# Patient Record
Sex: Male | Born: 1945 | Race: Black or African American | Hispanic: No | Marital: Married | Smoking: Former smoker
Health system: Southern US, Community
[De-identification: ages and names within clinical notes are randomized; demographics above are authoritative.]

## PROBLEM LIST (undated history)

## (undated) DIAGNOSIS — I739 Peripheral vascular disease, unspecified: Secondary | ICD-10-CM

## (undated) DIAGNOSIS — I10 Essential (primary) hypertension: Secondary | ICD-10-CM

## (undated) DIAGNOSIS — N400 Enlarged prostate without lower urinary tract symptoms: Secondary | ICD-10-CM

## (undated) HISTORY — PX: HERNIA REPAIR: SHX51

## (undated) HISTORY — DX: Essential (primary) hypertension: I10

---

## 2020-06-21 ENCOUNTER — Encounter: Payer: Self-pay | Admitting: Anesthesiology

## 2020-06-21 ENCOUNTER — Emergency Department (EMERGENCY_DEPARTMENT_HOSPITAL)
Admission: EM | Admit: 2020-06-21 | Discharge: 2020-06-21 | Disposition: A | Discharge status: Home / Self Care | Payer: Medicare Other | Source: Home / Self Care | Attending: Emergency Medicine | Admitting: Emergency Medicine

## 2020-06-21 ENCOUNTER — Other Ambulatory Visit: Payer: Self-pay

## 2020-06-21 ENCOUNTER — Encounter: Payer: Self-pay | Admitting: Radiology

## 2020-06-21 ENCOUNTER — Encounter
Admission: EM | Disposition: A | Discharge status: Home / Self Care | Payer: Self-pay | Source: Home / Self Care | Attending: Emergency Medicine

## 2020-06-21 ENCOUNTER — Emergency Department: Payer: Medicare Other

## 2020-06-21 DIAGNOSIS — K420 Umbilical hernia with obstruction, without gangrene: Secondary | ICD-10-CM | POA: Insufficient documentation

## 2020-06-21 DIAGNOSIS — Z20822 Contact with and (suspected) exposure to covid-19: Secondary | ICD-10-CM | POA: Insufficient documentation

## 2020-06-21 DIAGNOSIS — K436 Other and unspecified ventral hernia with obstruction, without gangrene: Secondary | ICD-10-CM | POA: Diagnosis not present

## 2020-06-21 LAB — COMPREHENSIVE METABOLIC PANEL
ALT: 18 U/L (ref 0–44)
AST: 17 U/L (ref 15–41)
Albumin: 4.2 g/dL (ref 3.5–5.0)
Alkaline Phosphatase: 72 U/L (ref 38–126)
Anion gap: 12 (ref 5–15)
BUN: 15 mg/dL (ref 8–23)
CO2: 28 mmol/L (ref 22–32)
Calcium: 9.9 mg/dL (ref 8.9–10.3)
Chloride: 98 mmol/L (ref 98–111)
Creatinine, Ser: 1.18 mg/dL (ref 0.61–1.24)
GFR, Estimated: >60 mL/min (ref >60)
Glucose, Bld: 185 mg/dL — ABNORMAL HIGH (ref 70–99)
Potassium: 3.8 mmol/L (ref 3.5–5.1)
Sodium: 138 mmol/L (ref 135–145)
Total Bilirubin: 1.7 mg/dL — ABNORMAL HIGH (ref 0.3–1.2)
Total Protein: 8.1 g/dL (ref 6.5–8.1)

## 2020-06-21 LAB — CBC
HCT: 47.9 % (ref 39.0–52.0)
Hemoglobin: 15.8 g/dL (ref 13.0–17.0)
MCH: 29.3 pg (ref 26.0–34.0)
MCHC: 33 g/dL (ref 30.0–36.0)
MCV: 88.9 fL (ref 80.0–100.0)
Platelets: 298 10*3/uL (ref 150–400)
RBC: 5.39 MIL/uL (ref 4.22–5.81)
RDW: 12.7 % (ref 11.5–15.5)
WBC: 13.5 10*3/uL — ABNORMAL HIGH (ref 4.0–10.5)
nRBC: 0 % (ref 0.0–0.2)

## 2020-06-21 LAB — LACTIC ACID, PLASMA: Lactic Acid, Venous: 2.2 mmol/L (ref 0.5–1.9)

## 2020-06-21 LAB — RESPIRATORY PANEL BY RT PCR (FLU A&B, COVID)
Influenza A by PCR: NEGATIVE
Influenza B by PCR: NEGATIVE
SARS Coronavirus 2 by RT PCR: NEGATIVE

## 2020-06-21 LAB — LIPASE, BLOOD: Lipase: 30 U/L (ref 11–51)

## 2020-06-21 SURGERY — REPAIR, HERNIA, VENTRAL, ROBOT-ASSISTED
Anesthesia: General

## 2020-06-21 MED ORDER — LACTATED RINGERS IV BOLUS
500.0000 mL | Freq: Once | INTRAVENOUS | Status: AC
  Administered 2020-06-21: 500 mL via INTRAVENOUS

## 2020-06-21 MED ORDER — HYDROMORPHONE HCL 1 MG/ML IJ SOLN
0.5000 mg | Freq: Once | INTRAMUSCULAR | Status: AC
  Administered 2020-06-21: 0.5 mg via INTRAVENOUS
  Filled 2020-06-21: qty 1

## 2020-06-21 MED ORDER — PROPOFOL 10 MG/ML IV BOLUS
INTRAVENOUS | Status: AC
  Filled 2020-06-21: qty 20

## 2020-06-21 MED ORDER — LOSARTAN POTASSIUM 50 MG PO TABS
100.0000 mg | ORAL_TABLET | Freq: Once | ORAL | Status: AC
  Administered 2020-06-21: 100 mg via ORAL
  Filled 2020-06-21: qty 2

## 2020-06-21 MED ORDER — LABETALOL HCL 200 MG PO TABS
200.0000 mg | ORAL_TABLET | Freq: Once | ORAL | Status: AC
  Administered 2020-06-21: 200 mg via ORAL
  Filled 2020-06-21: qty 1

## 2020-06-21 MED ORDER — ONDANSETRON HCL 4 MG/2ML IJ SOLN: 4.0000 mg | Freq: Once | INTRAMUSCULAR | Status: AC

## 2020-06-21 MED ORDER — PIPERACILLIN-TAZOBACTAM 3.375 G IVPB 30 MIN: 3.3750 g | Freq: Once | INTRAVENOUS | Status: DC

## 2020-06-21 MED ORDER — ALUM & MAG HYDROXIDE-SIMETH 200-200-20 MG/5ML PO SUSP
30.0000 mL | Freq: Once | ORAL | Status: AC
  Administered 2020-06-21: 30 mL via ORAL
  Filled 2020-06-21: qty 30

## 2020-06-21 MED ORDER — IOHEXOL 300 MG/ML  SOLN
100.0000 mL | Freq: Once | INTRAMUSCULAR | Status: AC | PRN
  Administered 2020-06-21: 100 mL via INTRAVENOUS
  Filled 2020-06-21: qty 100

## 2020-06-21 MED ORDER — FENTANYL CITRATE (PF) 100 MCG/2ML IJ SOLN
INTRAMUSCULAR | Status: AC
  Filled 2020-06-21: qty 2

## 2020-06-21 MED ORDER — ONDANSETRON HCL 4 MG/2ML IJ SOLN
INTRAMUSCULAR | Status: AC
  Administered 2020-06-21: 4 mg via INTRAVENOUS
  Filled 2020-06-21: qty 2

## 2020-06-21 MED ORDER — AMLODIPINE BESYLATE 5 MG PO TABS
10.0000 mg | ORAL_TABLET | Freq: Once | ORAL | Status: AC
  Administered 2020-06-21: 10 mg via ORAL
  Filled 2020-06-21: qty 2

## 2020-06-21 SURGICAL SUPPLY — 45 items
CANISTER SUCT 1200ML W/VALVE (MISCELLANEOUS) ×3 IMPLANT
CHLORAPREP W/TINT 26 (MISCELLANEOUS) ×3 IMPLANT
COVER TIP SHEARS 8 DVNC (MISCELLANEOUS) ×1 IMPLANT
COVER TIP SHEARS 8MM DA VINCI (MISCELLANEOUS) ×2
COVER WAND RF STERILE (DRAPES) ×3 IMPLANT
DECANTER SPIKE VIAL GLASS SM (MISCELLANEOUS) ×3 IMPLANT
DEFOGGER SCOPE WARMER CLEARIFY (MISCELLANEOUS) ×3 IMPLANT
DERMABOND ADVANCED (GAUZE/BANDAGES/DRESSINGS) ×2
DERMABOND ADVANCED .7 DNX12 (GAUZE/BANDAGES/DRESSINGS) ×1 IMPLANT
DEVICE SECURE STRAP 25 ABSORB (INSTRUMENTS) ×3 IMPLANT
DRAPE ARM DVNC X/XI (DISPOSABLE) ×4 IMPLANT
DRAPE COLUMN DVNC XI (DISPOSABLE) ×1 IMPLANT
DRAPE DA VINCI XI ARM (DISPOSABLE) ×8
DRAPE DA VINCI XI COLUMN (DISPOSABLE) ×2
ELECT REM PT RETURN 9FT ADLT (ELECTROSURGICAL) ×3
ELECTRODE REM PT RTRN 9FT ADLT (ELECTROSURGICAL) ×1 IMPLANT
GLOVE ORTHO TXT STRL SZ7.5 (GLOVE) ×9 IMPLANT
GOWN STRL REUS W/ TWL LRG LVL3 (GOWN DISPOSABLE) ×4 IMPLANT
GOWN STRL REUS W/TWL LRG LVL3 (GOWN DISPOSABLE) ×8
GRASPER SUT TROCAR 14GX15 (MISCELLANEOUS) IMPLANT
IRRIGATION STRYKERFLOW (MISCELLANEOUS) IMPLANT
IRRIGATOR STRYKERFLOW (MISCELLANEOUS)
IV NS 1000ML (IV SOLUTION)
IV NS 1000ML BAXH (IV SOLUTION) IMPLANT
KIT PINK PAD W/HEAD ARE REST (MISCELLANEOUS) ×3
KIT PINK PAD W/HEAD ARM REST (MISCELLANEOUS) ×1 IMPLANT
KIT TURNOVER KIT A (KITS) ×3 IMPLANT
NEEDLE HYPO 22GX1.5 SAFETY (NEEDLE) ×3 IMPLANT
NEEDLE INSUFFLATION 14GA 120MM (NEEDLE) ×3 IMPLANT
NS IRRIG 500ML POUR BTL (IV SOLUTION) ×3 IMPLANT
PACK LAP CHOLECYSTECTOMY (MISCELLANEOUS) ×3 IMPLANT
SCISSORS METZENBAUM CVD 33 (INSTRUMENTS) ×3 IMPLANT
SEAL CANN UNIV 5-8 DVNC XI (MISCELLANEOUS) ×3 IMPLANT
SEAL XI 5MM-8MM UNIVERSAL (MISCELLANEOUS) ×6
SET TUBE SMOKE EVAC HIGH FLOW (TUBING) ×3 IMPLANT
SOLUTION ELECTROLUBE (MISCELLANEOUS) ×3 IMPLANT
SUT MNCRL 4-0 (SUTURE) ×2
SUT MNCRL 4-0 27XMFL (SUTURE) ×1
SUT STRATAFIX 0 PDS+ CT-2 23 (SUTURE) ×3
SUT VICRYL 0 AB UR-6 (SUTURE) ×3 IMPLANT
SUT VLOC 90 2/L VL 12 GS22 (SUTURE) ×3 IMPLANT
SUTURE MNCRL 4-0 27XMF (SUTURE) ×1 IMPLANT
SUTURE STRATFX 0 PDS+ CT-2 23 (SUTURE) ×1 IMPLANT
TROCAR XCEL 12X100 BLDLESS (ENDOMECHANICALS) ×3 IMPLANT
TROCAR Z-THREAD FIOS 11X100 BL (TROCAR) ×3 IMPLANT

## 2020-06-21 NOTE — ED Notes (Addendum)
Pt presents to the ED for mid gastric abdominal pain that started late afternoon yesterday. Pt states pain came out of nowhere. Pt also states he had 2 episodes of vomiting. Denies diarrhea. Rates pain 8/10. Hx of hernia surgery. Denies CP/SOB/fevers. Pt A&Ox4 and NAD. Ambulatory to room.

## 2020-06-21 NOTE — Consult Note (Signed)
[facility] SURGICAL CONSULTATION NOTE (initial) - cpt:  50093 (Outpatient/ED)   HISTORY OF PRESENT ILLNESS (HPI):  74 y.o. male presented to [facility] ED today for evaluation of abdominal pain with associated incarcerated umbilical hernia and small bowel obstruction.  He experienced acute worsening of his known umbilical hernia discomfort to the point of pain without known precipitating factor.  He felt some significant bloating, and associated nausea.   He does endorse an episode of non-bloody, nonbilious emesis Surgery is consulted by ED physician Dr. Antoine Primas in this context for evaluation and management incarcerated umbilical hernia, with CT evidence of SBO.  He has a prior history of inguinal hernia repairs.  PAST MEDICAL HISTORY (PMH):  History reviewed. No pertinent past medical history.   PAST SURGICAL HISTORY [facility]):  Bilateral inguinal hernia repair  MEDICATIONS:  Prior to Admission medications   Not on File     ALLERGIES:  Not on File   SOCIAL HISTORY:  Social History   Socioeconomic History  . Marital status: Unknown    Spouse name: Not on file  . Number of children: Not on file  . Years of education: Not on file  . Highest education level: Not on file  Occupational History  . Not on file  Tobacco Use  . Smoking status: Not on file  Substance and Sexual Activity  . Alcohol use: Not on file  . Drug use: Not on file  . Sexual activity: Not on file  Other Topics Concern  . Not on file  Social History Narrative  . Not on file   Social Determinants of Health   Financial Resource Strain:   . Difficulty of Paying Living Expenses: Not on file  Food Insecurity:   . Worried About Programme researcher, broadcasting/film/video in the Last Year: Not on file  . Ran Out of Food in the Last Year: Not on file  Transportation Needs:   . Lack of Transportation (Medical): Not on file  . Lack of Transportation (Non-Medical): Not on file  Physical Activity:   . Days of Exercise  per Week: Not on file  . Minutes of Exercise per Session: Not on file  Stress:   . Feeling of Stress : Not on file  Social Connections:   . Frequency of Communication with Friends and Family: Not on file  . Frequency of Social Gatherings with Friends and Family: Not on file  . Attends Religious Services: Not on file  . Active Member of Clubs or Organizations: Not on file  . Attends Banker Meetings: Not on file  . Marital Status: Not on file  Intimate Partner Violence:   . Fear of Current or Ex-Partner: Not on file  . Emotionally Abused: Not on file  . Physically Abused: Not on file  . Sexually Abused: Not on file     FAMILY HISTORY:  No family history on file.    REVIEW OF SYSTEMS:  Constitutional: Negative for chills and fever.  HENT: Negative for sore throat.   Eyes: Negative for pain.  Respiratory: Negative for cough and stridor.   Cardiovascular: Negative for chest pain.  Gastrointestinal: Positive for abdominal pain, nausea and vomiting.  Genitourinary: Negative for dysuria.  Musculoskeletal: Negative for myalgias.  Skin: Negative for rash.  Neurological: Negative for seizures, loss of consciousness and headaches.  Psychiatric/Behavioral: Negative for suicidal ideas.   VITAL SIGNS:  Temp:  [98.4 F (36.9 C)] 98.4 F (36.9 C) (11/01 1203) Pulse Rate:  [93] 93 (11/01 1203)  Resp:  [20] 20 (11/01 1203) BP: (192)/(87) 192/87 (11/01 1203) SpO2:  [96 %] 96 % (11/01 1203) Weight:  [96.6 kg] 96.6 kg (11/01 1204)     Height: 5\' 11"  (180.3 cm) Weight: 96.6 kg BMI (Calculated): 29.72   INTAKE/OUTPUT:  No intake/output data recorded.  PHYSICAL EXAM:  Physical Exam Blood pressure (!) 192/87, pulse 93, temperature 98.4 F (36.9 C), temperature source Oral, resp. rate 20, height 5\' 11"  (1.803 m), weight 96.6 kg, SpO2 96 %. Last Weight  Most recent update: 06/21/2020 12:04 PM   Weight  96.6 kg (213 lb)            CONSTITUTIONAL: Well developed, and  nourished, appropriately responsive and aware without distress.   EYES: Sclera non-icteric.   EARS, NOSE, MOUTH AND THROAT: Mask worn.     Hearing is intact to voice.  NECK: Trachea is midline, and there is no jugular venous distension.  LYMPH NODES:  Lymph nodes in the neck are not enlarged. RESPIRATORY:  Lungs are clear, and breath sounds are equal bilaterally. Normal respiratory effort without pathologic use of accessory muscles. CARDIOVASCULAR: Heart is regular in rate and rhythm. GI: The abdomen is soft, nontender, and nondistended.  He has a large 4 to 5 cm raised umbilicus consistent with an incarcerated hernia, history of partial reduction was reported.  I was able to complete the reduction without causing remarkable pain or distress.  I was able to then palpate the size of the fascial defect.  There were no palpable masses. I did not appreciate hepatosplenomegaly. There were normal bowel sounds. MUSCULOSKELETAL:  Symmetrical muscle tone appreciated in all four extremities.    SKIN: Skin turgor is normal. No pathologic skin lesions appreciated.  NEUROLOGIC:  Motor and sensation appear grossly normal.  No gross skeletal defects.  Cranial nerves are grossly nonfocal.  PSYCH:  Alert and oriented to person, place and time. Affect is appropriate for situation.  Data Reviewed I have personally reviewed what is currently available of the patient's imaging, recent labs and medical records.    Labs:  CBC Latest Ref Rng & Units 06/21/2020  WBC 4.0 - 10.5 K/uL 13.5(H)  Hemoglobin 13.0 - 17.0 g/dL 13/08/2019  Hematocrit 39 - 52 % 47.9  Platelets 150 - 400 K/uL 298   CMP Latest Ref Rng & Units 06/21/2020  Glucose 70 - 99 mg/dL 78.9)  BUN 8 - 23 mg/dL 15  Creatinine 13/08/2019 - 381(O mg/dL 1.75  Sodium 1.02 - 5.85 mmol/L 138  Potassium 3.5 - 5.1 mmol/L 3.8  Chloride 98 - 111 mmol/L 98  CO2 22 - 32 mmol/L 28  Calcium 8.9 - 10.3 mg/dL 9.9  Total Protein 6.5 - 8.1 g/dL 8.1  Total Bilirubin 0.3 - 1.2 mg/dL  277)  Alkaline Phos 38 - 126 U/L 72  AST 15 - 41 U/L 17  ALT 0 - 44 U/L 18     Imaging studies:  I reviewed the CT images personally.  I concur with that noted below. Last 24 hrs: CT ABDOMEN PELVIS W CONTRAST  Result Date: 06/21/2020 CLINICAL DATA:  Abdominal pain since yesterday, nausea and vomiting, supraumbilical pain EXAM: CT ABDOMEN AND PELVIS WITH CONTRAST TECHNIQUE: Multidetector CT imaging of the abdomen and pelvis was performed using the standard protocol following bolus administration of intravenous contrast. CONTRAST:  2.3(N OMNIPAQUE IOHEXOL 300 MG/ML  SOLN COMPARISON:  None. FINDINGS: Lower chest: No acute abnormality. Hepatobiliary: Calcified gallstones are identified without cholecystitis. The liver is grossly unremarkable. No intrahepatic duct  dilation. Pancreas: Cystic dilatation of the pancreatic duct is seen within the head and proximal body, measuring up to 11 mm, nonspecific. No focal pancreatic parenchymal abnormalities. Spleen: Normal in size without focal abnormality. Adrenals/Urinary Tract: Bilateral simple appearing renal cysts are noted. Otherwise the kidneys enhance normally and symmetrically. The adrenals are unremarkable. Bladder is moderately distended, with no focal abnormality. Stomach/Bowel: There is an umbilical hernia containing a segment of distal jejunum, abdominal wall defect measuring 3.1 cm. This results in high-grade small bowel obstruction, with dilatation of the proximal small bowel measuring up to 3.4 cm. Distal small bowel decompressed. Scattered diverticulosis of the colon without diverticulitis. There is prominent mural thickening at the gastroesophageal junction, incompletely evaluated on this study. This measures up to 10 mm in thickness, and is circumferential at the GE junction and gastric cardia. Endoscopy is recommended to exclude neoplasm. Vascular/Lymphatic: Aortic atherosclerosis. No enlarged abdominal or pelvic lymph nodes. Reproductive: Prostate  is heterogeneous and enlarged, measuring 7.5 by 7.0 x 8.2 cm, with mass effect upon the inferior aspect of the bladder. Other: No free fluid or free gas. Musculoskeletal: No acute or destructive bony lesions. Reconstructed images demonstrate multilevel lumbar spondylosis. IMPRESSION: 1. High-grade small-bowel obstruction at the level the mid jejunum, due to segmental herniation of jejunum within an umbilical hernia. Fat stranding within the umbilical hernia sac suggests incarceration. No bowel wall ischemia. 2. Mural thickening at the gastroesophageal junction/gastric cardia, for which endoscopy is recommended to exclude underlying neoplasm. 3. Cholelithiasis without cholecystitis. 4. Marked enlargement of the prostate. 5.  Aortic Atherosclerosis (ICD10-I70.0). Electronically Signed   By: Sharlet Salina M.D.   On: 06/21/2020 16:08     Assessment/Plan:  74 y.o. male with small bowel obstruction secondary to incarcerated umbilical hernia, currently completely reduced.  There are no problems to display for this patient.   -He has elected to defer umbilical hernia repair for now, I offered urgent repair this evening.  It is no longer an emergency.  - I discussed possibility of incarceration, strangulation, enlargement in size over time, and the risk of emergency surgery in the face of strangulation.  Also discussed the risk of surgery including recurrence which can be up to 30% in the case of complex hernias, use of prosthetic materials (mesh) and the increased risk of infxn, post-op infxn and the possible need for re-operation and removal of mesh if used, possibility of post-op SBO or ileus, and the risks of general anesthetic including MI, CVA, sudden death or even reaction to anesthetic medications. The patient and family understands the risks, any and all questions were answered to the patient's satisfaction.    All of the above findings and recommendations were discussed with the patient and  family(if  present), and all of patient's and present family's questions were answered to their expressed satisfaction.  Thank you for the opportunity to participate in this patient's care.   -- Campbell Lerner, M.D., FACS 06/21/2020, 6:46 PM

## 2020-06-21 NOTE — ED Triage Notes (Addendum)
Pt here with abd pain that started late yesterday evening. Pt states that he has also been nauseous and had 1 episode of vomiting. Pt states pain is in the center of his abd right above his navel. Pt reports last BM was 2 days ago. PT tried OTC laxatives but pain did not resolve. Pt NAD in triage.

## 2020-06-21 NOTE — ED Provider Notes (Signed)
[facility] Emergency Department Provider Note  ____________________________________________   First MD Initiated Contact with Patient 06/21/20 1449     (approximate)  I have reviewed the triage vital signs and the nursing notes.   HISTORY  Chief Complaint Abdominal Pain   HPI Jacob Gibson is a 74 y.o. male with a past medical history of chronic umbilical hernia and hypertension who presents for assessment of generalized abdominal pain and bloating that began yesterday.  Patient states he has never had pain like this before.  He states he was wondering if it was constipation and he took an over-the-counter laxative and had a normal bowel movement but his pain did not improve.  He also states he took 2 aspirins yesterday and 4 today but this did not help the pain.  He denies any headache earache sore throat chest pain cough or shortness of breath does endorse an episode of nonbloody nonbilious emesis.  No back pain, urinary symptoms, scrotal or penile pain, rash, extremity pain, other acute complaints.  No other clear alleviating or aggravating factors.  Patient denies EtOH or illicit drug use.         History reviewed. No pertinent past medical history.  There are no problems to display for this patient.     Prior to Admission medications   Not on File    Allergies Patient has no allergy information on record.  No family history on file.  Social History Social History   Tobacco Use  . Smoking status: Not on file  Substance Use Topics  . Alcohol use: Not on file  . Drug use: Not on file    Review of Systems  Review of Systems  Constitutional: Negative for chills and fever.  HENT: Negative for sore throat.   Eyes: Negative for pain.  Respiratory: Negative for cough and stridor.   Cardiovascular: Negative for chest pain.  Gastrointestinal: Positive for abdominal pain, nausea and vomiting.  Genitourinary: Negative for dysuria.    Musculoskeletal: Negative for myalgias.  Skin: Negative for rash.  Neurological: Negative for seizures, loss of consciousness and headaches.  Psychiatric/Behavioral: Negative for suicidal ideas.  All other systems reviewed and are negative.     ____________________________________________   PHYSICAL EXAM:  VITAL SIGNS: ED Triage Vitals  Enc Vitals Group     BP 06/21/20 1203 (!) 192/87     Pulse Rate 06/21/20 1203 93     Resp 06/21/20 1203 20     Temp 06/21/20 1203 98.4 F (36.9 C)     Temp Source 06/21/20 1203 Oral     SpO2 06/21/20 1203 96 %     Weight 06/21/20 1204 213 lb (96.6 kg)     Height 06/21/20 1204 5\' 11"  (1.803 m)     Head Circumference --      Peak Flow --      Pain Score 06/21/20 1203 8     Pain Loc --      Pain Edu? --      Excl. in GC? --    Vitals:   06/21/20 1203  BP: (!) 192/87  Pulse: 93  Resp: 20  Temp: 98.4 F (36.9 C)  SpO2: 96%   Physical Exam Vitals and nursing note reviewed.  Constitutional:      Appearance: He is well-developed.  HENT:     Head: Normocephalic and atraumatic.  Eyes:     Conjunctiva/sclera: Conjunctivae normal.  Cardiovascular:     Rate and Rhythm: Normal rate and regular  rhythm.     Heart sounds: No murmur heard.   Pulmonary:     Effort: Pulmonary effort is normal. No respiratory distress.     Breath sounds: Normal breath sounds.  Abdominal:     Palpations: Abdomen is soft.     Tenderness: There is generalized abdominal tenderness. There is no right CVA tenderness or left CVA tenderness.  Musculoskeletal:     Cervical back: Neck supple.  Skin:    General: Skin is warm and dry.  Neurological:     General: No focal deficit present.     Mental Status: He is alert.  Psychiatric:        Mood and Affect: Mood normal.     Patient does have an umbilical hernia that is reducible on my exam. ____________________________________________   LABS (all labs ordered are listed, but only abnormal results are  displayed)  Labs Reviewed  COMPREHENSIVE METABOLIC PANEL - Abnormal; Notable for the following components:      Result Value   Glucose, Bld 185 (*)    Total Bilirubin 1.7 (*)    All other components within normal limits  CBC - Abnormal; Notable for the following components:   WBC 13.5 (*)    All other components within normal limits  LACTIC ACID, PLASMA - Abnormal; Notable for the following components:   Lactic Acid, Venous 2.2 (*)    All other components within normal limits  RESPIRATORY PANEL BY RT PCR (FLU A&B, COVID)  LIPASE, BLOOD  URINALYSIS, COMPLETE (UACMP) WITH MICROSCOPIC  LACTIC ACID, PLASMA   ____________________________________________  EKG Sinus rhythm no evidence of acute ischemia or underlying arrhythmia.  Normal axis and unremarkable intervals. ____________________________________________  RADIOLOGY   Official radiology report(s): CT ABDOMEN PELVIS W CONTRAST  Result Date: 06/21/2020 CLINICAL DATA:  Abdominal pain since yesterday, nausea and vomiting, supraumbilical pain EXAM: CT ABDOMEN AND PELVIS WITH CONTRAST TECHNIQUE: Multidetector CT imaging of the abdomen and pelvis was performed using the standard protocol following bolus administration of intravenous contrast. CONTRAST:  OMNIPAQUE IOHEXOL 300 MG/ML  SOLN COMPARISON:  None. FINDINGS: Lower chest: No acute abnormality. Hepatobiliary: Calcified gallstones are identified without cholecystitis. The liver is grossly unremarkable. No intrahepatic duct dilation. Pancreas: Cystic dilatation of the pancreatic duct is seen within the head and proximal body, measuring up to 11 mm, nonspecific. No focal pancreatic parenchymal abnormalities. Spleen: Normal in size without focal abnormality. Adrenals/Urinary Tract: Bilateral simple appearing renal cysts are noted. Otherwise the kidneys enhance normally and symmetrically. The adrenals are unremarkable. Bladder is moderately distended, with no focal abnormality.  Stomach/Bowel: There is an umbilical hernia containing a segment of distal jejunum, abdominal wall defect measuring 3.1 cm. This results in high-grade small bowel obstruction, with dilatation of the proximal small bowel measuring up to 3.4 cm. Distal small bowel decompressed. Scattered diverticulosis of the colon without diverticulitis. There is prominent mural thickening at the gastroesophageal junction, incompletely evaluated on this study. This measures up to 10 mm in thickness, and is circumferential at the GE junction and gastric cardia. Endoscopy is recommended to exclude neoplasm. Vascular/Lymphatic: Aortic atherosclerosis. No enlarged abdominal or pelvic lymph nodes. Reproductive: Prostate is heterogeneous and enlarged, measuring 7.5 by 7.0 x 8.2 cm, with mass effect upon the inferior aspect of the bladder. Other: No free fluid or free gas. Musculoskeletal: No acute or destructive bony lesions. Reconstructed images demonstrate multilevel lumbar spondylosis. IMPRESSION: 1. High-grade small-bowel obstruction at the level the mid jejunum, due to segmental herniation of jejunum within an umbilical hernia.  Fat stranding within the umbilical hernia sac suggests incarceration. No bowel wall ischemia. 2. Mural thickening at the gastroesophageal junction/gastric cardia, for which endoscopy is recommended to exclude underlying neoplasm. 3. Cholelithiasis without cholecystitis. 4. Marked enlargement of the prostate. 5.  Aortic Atherosclerosis (ICD10-I70.0). Electronically Signed   By: Sharlet Salina M.D.   On: 06/21/2020 16:08    ____________________________________________   PROCEDURES  Procedure(s) performed (including Critical Care):  Procedures   ____________________________________________   INITIAL IMPRESSION / ASSESSMENT AND PLAN / ED COURSE        Patient presents with above-stated history exam for assessment of abdominal pain that began yesterday.  Patient is hypertensive with a BP of  192/87 otherwise stable vital signs on room air.  Patient does note he is not taking his blood pressure medicines today.  Exam as above remarkable for generalized abdominal tenderness with some distention and umbilical hernia that is reducible.  Differential includes but is not limited to diverticulitis, appendicitis, pyelonephritis, pancreatitis, acute cholestasis, SBO, and kidney stone.  CT as noted above concerning for high-grade SBO with jejunum likely incarcerated in patient's umbilical hernia.  Patient also was noted to have cholelithiasis without evidence of cholecystitis and atherosclerosis and similar to his prostate.  No other acute intra-abdominal findings on CT including evidence of appendicitis, diverticulitis, SBO, pancreatitis, pyelonephritis, or other acute process.  Lipase is 30 not consistent with acute pancreatitis.  CMP remarkable for glucose of 185 and a T bili of 1.7 with no other significant electrolyte or metabolic derangements.  CBC is elevated at 13.5 with normal hemoglobin and platelets.  Lactic acid is 2.2.  Given concern for high-grade small bowel obstruction with some stranding around the hernia and elevated white blood cell count and lactic acid I consulted general surgery Dr. Claudine Mouton stated he would come see the patient emergency room.  Per Dr. Zoila Shutter he was able to fully reduce patient's hernia.  He does not recommend admission or any additional work-up including repeat labs or lactic acid IV fluids antibiotics.  He stated he would see the patient in outpatient clinic.  Given on my assessment patient's pain is fully reduced and he states his pain is relieved with otherwise stable vital signs I leave this is reasonable.  Patient discharged stable condition.  Strict precautions advised and discussed.  Also advised patient of his blood pressure rechecked it is quite elevated today as well as his blood glucose which was also noted to be elevated  today.   ____________________________________________   FINAL CLINICAL IMPRESSION(S) / ED DIAGNOSES  Final diagnoses:  Umbilical hernia with obstruction, without gangrene    Medications  HYDROmorphone (DILAUDID) injection 0.5 mg (0.5 mg Intravenous Given 06/21/20 1535)  alum & mag hydroxide-simeth (MAALOX/MYLANTA) 200-200-20 MG/5ML suspension 30 mL (30 mLs Oral Given 06/21/20 1542)  amLODipine (NORVASC) tablet 10 mg (10 mg Oral Given 06/21/20 1601)  lactated ringers bolus 500 mL (500 mLs Intravenous New Bag/Given 06/21/20 1603)  losartan (COZAAR) tablet 100 mg (100 mg Oral Given 06/21/20 1543)  labetalol (NORMODYNE) tablet 200 mg (200 mg Oral Given 06/21/20 1602)  iohexol (OMNIPAQUE) 300 MG/ML solution 100 mL (100 mLs Intravenous Contrast Given 06/21/20 1550)  ondansetron (ZOFRAN) injection 4 mg (4 mg Intravenous Given 06/21/20 1542)     ED Discharge Orders    None       Note:  This document was prepared using Dragon voice recognition software and may include unintentional dictation errors.   Gilles Chiquito, MD 06/21/20 [phone]

## 2020-06-22 ENCOUNTER — Observation Stay: Payer: Medicare Other | Admitting: Anesthesiology

## 2020-06-22 ENCOUNTER — Encounter: Payer: Self-pay | Admitting: Surgery

## 2020-06-22 ENCOUNTER — Ambulatory Visit: Payer: Self-pay | Admitting: Surgery

## 2020-06-22 ENCOUNTER — Other Ambulatory Visit: Payer: Self-pay

## 2020-06-22 ENCOUNTER — Inpatient Hospital Stay
Admission: RE | Admit: 2020-06-22 | Discharge: 2020-06-24 | DRG: 354 | Disposition: A | Discharge status: Home / Self Care | Payer: Medicare Other | Attending: Surgery | Admitting: Surgery

## 2020-06-22 ENCOUNTER — Ambulatory Visit (INDEPENDENT_AMBULATORY_CARE_PROVIDER_SITE_OTHER): Payer: Medicare Other | Admitting: Surgery

## 2020-06-22 ENCOUNTER — Encounter
Admission: RE | Disposition: A | Discharge status: Home / Self Care | Payer: Self-pay | Source: Ambulatory Visit | Attending: Surgery

## 2020-06-22 VITALS — BP 100/63 | HR 86 | Temp 98.0°F | Resp 12 | Ht 71.0 in | Wt 215.2 lb

## 2020-06-22 DIAGNOSIS — K420 Umbilical hernia with obstruction, without gangrene: Secondary | ICD-10-CM

## 2020-06-22 DIAGNOSIS — N179 Acute kidney failure, unspecified: Secondary | ICD-10-CM | POA: Diagnosis not present

## 2020-06-22 DIAGNOSIS — K436 Other and unspecified ventral hernia with obstruction, without gangrene: Principal | ICD-10-CM | POA: Diagnosis present

## 2020-06-22 DIAGNOSIS — Z20822 Contact with and (suspected) exposure to covid-19: Secondary | ICD-10-CM | POA: Diagnosis present

## 2020-06-22 DIAGNOSIS — E861 Hypovolemia: Secondary | ICD-10-CM | POA: Diagnosis present

## 2020-06-22 DIAGNOSIS — K460 Unspecified abdominal hernia with obstruction, without gangrene: Secondary | ICD-10-CM | POA: Diagnosis present

## 2020-06-22 HISTORY — PX: XI ROBOTIC ASSISTED VENTRAL HERNIA: SHX6789

## 2020-06-22 SURGERY — REPAIR, HERNIA, VENTRAL, ROBOT-ASSISTED
Anesthesia: General

## 2020-06-22 MED ORDER — GLYCOPYRROLATE 0.2 MG/ML IJ SOLN
INTRAMUSCULAR | Status: AC
  Filled 2020-06-22: qty 2

## 2020-06-22 MED ORDER — PHENYLEPHRINE HCL (PRESSORS) 10 MG/ML IV SOLN
INTRAVENOUS | Status: DC | PRN
  Administered 2020-06-22 (×3): 100 ug via INTRAVENOUS

## 2020-06-22 MED ORDER — PROPOFOL 10 MG/ML IV BOLUS
INTRAVENOUS | Status: AC
  Filled 2020-06-22: qty 20

## 2020-06-22 MED ORDER — FENTANYL CITRATE (PF) 100 MCG/2ML IJ SOLN
INTRAMUSCULAR | Status: DC | PRN
  Administered 2020-06-22: 25 ug via INTRAVENOUS
  Administered 2020-06-22: 50 ug via INTRAVENOUS
  Administered 2020-06-22: 25 ug via INTRAVENOUS

## 2020-06-22 MED ORDER — PROPOFOL 10 MG/ML IV BOLUS
INTRAVENOUS | Status: DC | PRN
  Administered 2020-06-22: 150 mg via INTRAVENOUS

## 2020-06-22 MED ORDER — ACETAMINOPHEN 10 MG/ML IV SOLN
INTRAVENOUS | Status: AC
  Filled 2020-06-22: qty 100

## 2020-06-22 MED ORDER — ROCURONIUM BROMIDE 10 MG/ML (PF) SYRINGE
PREFILLED_SYRINGE | INTRAVENOUS | Status: AC
  Filled 2020-06-22: qty 10

## 2020-06-22 MED ORDER — LIDOCAINE HCL (CARDIAC) PF 100 MG/5ML IV SOSY
PREFILLED_SYRINGE | INTRAVENOUS | Status: DC | PRN
  Administered 2020-06-22: 100 mg via INTRAVENOUS

## 2020-06-22 MED ORDER — BUPIVACAINE LIPOSOME 1.3 % IJ SUSP
INTRAMUSCULAR | Status: AC
  Filled 2020-06-22: qty 20

## 2020-06-22 MED ORDER — SODIUM CHLORIDE 0.9 % IV SOLN
INTRAVENOUS | Status: DC | PRN
  Administered 2020-06-22: 42 mL/h via INTRAVENOUS

## 2020-06-22 MED ORDER — FENTANYL CITRATE (PF) 100 MCG/2ML IJ SOLN: 25.0000 ug | INTRAMUSCULAR | Status: DC | PRN

## 2020-06-22 MED ORDER — DEXAMETHASONE SODIUM PHOSPHATE 10 MG/ML IJ SOLN
INTRAMUSCULAR | Status: DC | PRN
  Administered 2020-06-22: 10 mg via INTRAVENOUS

## 2020-06-22 MED ORDER — SUGAMMADEX SODIUM 200 MG/2ML IV SOLN
INTRAVENOUS | Status: DC | PRN
  Administered 2020-06-22: 200 mg via INTRAVENOUS

## 2020-06-22 MED ORDER — BUPIVACAINE LIPOSOME 1.3 % IJ SUSP
INTRAMUSCULAR | Status: DC | PRN
  Administered 2020-06-22: 20 mL

## 2020-06-22 MED ORDER — CHLORHEXIDINE GLUCONATE 0.12 % MT SOLN: 15.0000 mL | Freq: Once | OROMUCOSAL | Status: DC

## 2020-06-22 MED ORDER — SUCCINYLCHOLINE CHLORIDE 20 MG/ML IJ SOLN
INTRAMUSCULAR | Status: DC | PRN
  Administered 2020-06-22: 100 mg via INTRAVENOUS

## 2020-06-22 MED ORDER — VASOPRESSIN 20 UNIT/ML IV SOLN
INTRAVENOUS | Status: AC
  Filled 2020-06-22: qty 1

## 2020-06-22 MED ORDER — LACTATED RINGERS IV SOLN: INTRAVENOUS | Status: DC

## 2020-06-22 MED ORDER — FENTANYL CITRATE (PF) 100 MCG/2ML IJ SOLN: 50.0000 ug | Freq: Once | INTRAMUSCULAR | Status: AC

## 2020-06-22 MED ORDER — LIDOCAINE HCL (PF) 2 % IJ SOLN
INTRAMUSCULAR | Status: AC
  Filled 2020-06-22: qty 5

## 2020-06-22 MED ORDER — FENTANYL CITRATE (PF) 100 MCG/2ML IJ SOLN
INTRAMUSCULAR | Status: AC
  Administered 2020-06-22: 50 ug via INTRAVENOUS
  Filled 2020-06-22: qty 2

## 2020-06-22 MED ORDER — SEVOFLURANE IN SOLN
RESPIRATORY_TRACT | Status: AC
  Filled 2020-06-22: qty 250

## 2020-06-22 MED ORDER — ONDANSETRON HCL 4 MG/2ML IJ SOLN: 4.0000 mg | Freq: Once | INTRAMUSCULAR | Status: DC | PRN

## 2020-06-22 MED ORDER — ORAL CARE MOUTH RINSE: 15.0000 mL | Freq: Once | OROMUCOSAL | Status: DC

## 2020-06-22 MED ORDER — EPHEDRINE SULFATE 50 MG/ML IJ SOLN
INTRAMUSCULAR | Status: DC | PRN
  Administered 2020-06-22: 15 mg via INTRAVENOUS

## 2020-06-22 MED ORDER — ONDANSETRON HCL 4 MG/2ML IJ SOLN
INTRAMUSCULAR | Status: DC | PRN
  Administered 2020-06-22: 4 mg via INTRAVENOUS

## 2020-06-22 MED ORDER — LACTATED RINGERS IV SOLN: INTRAVENOUS | Status: DC | PRN

## 2020-06-22 MED ORDER — GLYCOPYRROLATE 0.2 MG/ML IJ SOLN
INTRAMUSCULAR | Status: DC | PRN
  Administered 2020-06-22: .4 mg via INTRAVENOUS

## 2020-06-22 MED ORDER — FENTANYL CITRATE (PF) 100 MCG/2ML IJ SOLN
25.0000 ug | INTRAMUSCULAR | Status: DC | PRN
  Administered 2020-06-22 (×3): 25 ug via INTRAVENOUS

## 2020-06-22 MED ORDER — SUCCINYLCHOLINE CHLORIDE 200 MG/10ML IV SOSY
PREFILLED_SYRINGE | INTRAVENOUS | Status: AC
  Filled 2020-06-22: qty 10

## 2020-06-22 MED ORDER — ONDANSETRON HCL 4 MG/2ML IJ SOLN
INTRAMUSCULAR | Status: AC
  Filled 2020-06-22: qty 2

## 2020-06-22 MED ORDER — FENTANYL CITRATE (PF) 100 MCG/2ML IJ SOLN
INTRAMUSCULAR | Status: AC
  Administered 2020-06-22: 25 ug via INTRAVENOUS
  Filled 2020-06-22: qty 2

## 2020-06-22 MED ORDER — BUPIVACAINE-EPINEPHRINE (PF) 0.25% -1:200000 IJ SOLN
INTRAMUSCULAR | Status: AC
  Filled 2020-06-22: qty 30

## 2020-06-22 MED ORDER — FENTANYL CITRATE (PF) 100 MCG/2ML IJ SOLN
INTRAMUSCULAR | Status: AC
  Filled 2020-06-22: qty 2

## 2020-06-22 MED ORDER — CEFAZOLIN SODIUM-DEXTROSE 2-4 GM/100ML-% IV SOLN
2.0000 g | Freq: Three times a day (TID) | INTRAVENOUS | Status: DC
  Administered 2020-06-22: 2 g via INTRAVENOUS

## 2020-06-22 MED ORDER — ACETAMINOPHEN 10 MG/ML IV SOLN
INTRAVENOUS | Status: DC | PRN
  Administered 2020-06-22: 1000 mg via INTRAVENOUS

## 2020-06-22 MED ORDER — CHLORHEXIDINE GLUCONATE 0.12 % MT SOLN
OROMUCOSAL | Status: AC
  Administered 2020-06-22: 15 mL
  Filled 2020-06-22: qty 15

## 2020-06-22 MED ORDER — CEFAZOLIN SODIUM-DEXTROSE 2-4 GM/100ML-% IV SOLN
INTRAVENOUS | Status: AC
  Filled 2020-06-22: qty 100

## 2020-06-22 MED ORDER — DEXAMETHASONE SODIUM PHOSPHATE 10 MG/ML IJ SOLN
INTRAMUSCULAR | Status: AC
  Filled 2020-06-22: qty 1

## 2020-06-22 MED ORDER — VASOPRESSIN 20 UNIT/ML IV SOLN
INTRAVENOUS | Status: DC | PRN
  Administered 2020-06-22: 2 [IU] via INTRAVENOUS
  Administered 2020-06-22 (×2): 1 [IU] via INTRAVENOUS
  Administered 2020-06-22: 2 [IU] via INTRAVENOUS

## 2020-06-22 MED ORDER — ROCURONIUM BROMIDE 100 MG/10ML IV SOLN
INTRAVENOUS | Status: DC | PRN
  Administered 2020-06-22: 40 mg via INTRAVENOUS

## 2020-06-22 SURGICAL SUPPLY — 51 items
CANISTER SUCT 1200ML W/VALVE (MISCELLANEOUS) IMPLANT
CHLORAPREP W/TINT 26 (MISCELLANEOUS) ×3 IMPLANT
COVER LIGHT HANDLE STERIS (MISCELLANEOUS) ×3 IMPLANT
COVER TIP SHEARS 8 DVNC (MISCELLANEOUS) ×1 IMPLANT
COVER TIP SHEARS 8MM DA VINCI (MISCELLANEOUS) ×2
COVER WAND RF STERILE (DRAPES) ×3 IMPLANT
DECANTER SPIKE VIAL GLASS SM (MISCELLANEOUS) IMPLANT
DEFOGGER SCOPE WARMER CLEARIFY (MISCELLANEOUS) ×3 IMPLANT
DERMABOND ADVANCED (GAUZE/BANDAGES/DRESSINGS) ×2
DERMABOND ADVANCED .7 DNX12 (GAUZE/BANDAGES/DRESSINGS) ×1 IMPLANT
DEVICE SECURE STRAP 25 ABSORB (INSTRUMENTS) ×3 IMPLANT
DRAPE ARM DVNC X/XI (DISPOSABLE) ×4 IMPLANT
DRAPE COLUMN DVNC XI (DISPOSABLE) ×1 IMPLANT
DRAPE DA VINCI XI ARM (DISPOSABLE) ×8
DRAPE DA VINCI XI COLUMN (DISPOSABLE) ×2
ELECT CAUTERY BLADE 6.4 (BLADE) ×3 IMPLANT
ELECT REM PT RETURN 9FT ADLT (ELECTROSURGICAL) ×3
ELECTRODE REM PT RTRN 9FT ADLT (ELECTROSURGICAL) ×1 IMPLANT
GLOVE ORTHO TXT STRL SZ7.5 (GLOVE) ×9 IMPLANT
GOWN STRL REUS W/ TWL LRG LVL3 (GOWN DISPOSABLE) ×3 IMPLANT
GOWN STRL REUS W/TWL LRG LVL3 (GOWN DISPOSABLE) ×6
GRASPER SUT TROCAR 14GX15 (MISCELLANEOUS) IMPLANT
IRRIGATION STRYKERFLOW (MISCELLANEOUS) IMPLANT
IRRIGATOR STRYKERFLOW (MISCELLANEOUS)
IV NS 1000ML (IV SOLUTION)
IV NS 1000ML BAXH (IV SOLUTION) IMPLANT
KIT PINK PAD W/HEAD ARE REST (MISCELLANEOUS) ×3
KIT PINK PAD W/HEAD ARM REST (MISCELLANEOUS) ×1 IMPLANT
KIT TURNOVER KIT A (KITS) IMPLANT
MESH VENTRALIGHT ST 4X6IN (Mesh General) ×3 IMPLANT
NEEDLE HYPO 22GX1.5 SAFETY (NEEDLE) ×3 IMPLANT
NEEDLE INSUFFLATION 14GA 120MM (NEEDLE) ×3 IMPLANT
NS IRRIG 500ML POUR BTL (IV SOLUTION) ×3 IMPLANT
PACK LAP CHOLECYSTECTOMY (MISCELLANEOUS) ×3 IMPLANT
PENCIL ELECTRO HAND CTR (MISCELLANEOUS) ×3 IMPLANT
SCISSORS METZENBAUM CVD 33 (INSTRUMENTS) ×3 IMPLANT
SEAL CANN UNIV 5-8 DVNC XI (MISCELLANEOUS) ×3 IMPLANT
SEAL XI 5MM-8MM UNIVERSAL (MISCELLANEOUS) ×6
SET TUBE SMOKE EVAC HIGH FLOW (TUBING) ×3 IMPLANT
SOLUTION ELECTROLUBE (MISCELLANEOUS) ×3 IMPLANT
SUT MNCRL 4-0 (SUTURE) ×2
SUT MNCRL 4-0 27XMFL (SUTURE) ×1
SUT STRATAFIX 0 PDS+ CT-2 23 (SUTURE) ×3
SUT VICRYL 0 AB UR-6 (SUTURE) ×3 IMPLANT
SUT VLOC 90 2/L VL 12 GS22 (SUTURE) ×3 IMPLANT
SUTURE MNCRL 4-0 27XMF (SUTURE) ×1 IMPLANT
SUTURE STRATFX 0 PDS+ CT-2 23 (SUTURE) ×1 IMPLANT
TAPE TRANSPORE STRL 2 31045 (GAUZE/BANDAGES/DRESSINGS) ×3 IMPLANT
TRAY FOLEY SLVR 16FR LF STAT (SET/KITS/TRAYS/PACK) ×3 IMPLANT
TROCAR XCEL 12X100 BLDLESS (ENDOMECHANICALS) ×3 IMPLANT
TROCAR Z-THREAD FIOS 11X100 BL (TROCAR) ×3 IMPLANT

## 2020-06-22 NOTE — Op Note (Signed)
Robotic assisted laparoscopic incarcerated ventral hernia with small bowel obstruction, repair IPOM using oval ventralight BARD mesh   Pre-operative Diagnosis: Umbilical hernia, with incarceration and small bowel obstruction.   Post-operative Diagnosis: same   Surgeon:  Campbell Lerner, M.D., FACS   Anesthesia: Gen. with endotracheal tube   Findings: 4 cm defect with inflamed/swollen small bowel clearly viable previously incarcerated, reduced after induction of anesthesia.  Multiple loops of distended small bowel consistent with known small bowel obstruction.  Estimated Blood Loss: 50 cc   Complications: none           Procedure Details  The patient was seen again in the Holding Room. The benefits, complications, treatment options, and expected outcomes were discussed with the patient. The risks of bleeding, infection, recurrence of symptoms, failure to resolve symptoms, bowel injury, mesh placement, mesh infection, any of which could require further surgery were reviewed with the patient. The likelihood of improving the patient's symptoms with return to their baseline status is good.  The patient and/or family concurred with the proposed plan, giving informed consent.  The patient was taken to Operating Room, identified and the procedure verified.  A Time Out was held and the above information confirmed.   Prior to the induction of general anesthesia, antibiotic prophylaxis was administered. VTE prophylaxis was in place. General endotracheal anesthesia was then administered and tolerated well. After the induction, the abdomen was prepped with Chloraprep and draped in the sterile fashion. The patient was positioned in the supine position.  After local infiltration of quarter percent Marcaine with epinephrine, stab incision was made left upper quadrant.  Just below the costal margin approximately midclavicular line the Veress needle is passed with sensation of the layers to penetrate the  abdominal wall and into the peritoneum.  Saline drop test is confirmed peritoneal placement.  Insufflation is initiated with carbon dioxide to pressures of 15 mmHg. Marcaine quarter percent with Exparel was used to inject all the incision sites. We used a left upper quadrant subcostal incision and using a robotic 8.5 mm trocar, it was inserted with pneumoperitoneum previously obtained.  No hemodynamic compromise, no evidence of intraperitoneal injury.   2 additional 8.5 mm ports were placed under direct visualization on the left lateral abdomen.  I visualized the hernia and there was an umbilical hernia measuring approximately 4 cm.    The robot was brought to the surgical field and docked in the standard fashion.  We made sure that all instrumentation was kept under direct vision at all times and there was no collision between the arms.  I scrubbed out and went to the console. Falciform and adjacent preperitoneal adipose and umbilical ligaments and hernia sac were taken down with electrocautery to allow adequate mesh placement to be secured to the fascial tissue. Confirm and measured that the defect was 4 cm.  Using a 0 V-lock suture we closed the ventral defect primarily, on a transverse axis as this seemed to hold the least tension.  I kept the pressures reduced to 8 mmHg for this closure.  I used the same suture to secure the mesh and centered it over the fascial closure.  The mesh was secured circumferentially to the abdominal wall using 2-0 V-lock in the standard fashion.   The mesh appeared well secured against the  abdominal wall. A second look laparoscopy revealed no evidence of intra-abdominal injury.    All the needles were removed under direct visualization.  The instruments were removed and the robot was undocked.  The laparoscopic ports were removed under direct visualization and the pneumoperitoneum was deflated.  Incisions were closed with  4-0 Monocryl, Dermabond was used to coat the skin.   Patient tolerated procedure well and there were no immediate complications. Needle and laparotomy counts were correct    Campbell Lerner M.D., [facility] 06/22/2020 10:50 PM

## 2020-06-22 NOTE — Anesthesia Preprocedure Evaluation (Signed)
Anesthesia Evaluation  Patient identified by MRN, date of birth, ID band Patient awake    Reviewed: Allergy & Precautions, H&P , NPO status , Patient's Chart, lab work & pertinent test results, reviewed documented beta blocker date and time   History of Anesthesia Complications Negative for: history of anesthetic complications  Airway Mallampati: I  TM Distance: >3 FB Neck ROM: full    Dental  (+) Dental Advidsory Given, Edentulous Upper, Upper Dentures, Missing   Pulmonary neg pulmonary ROS,    Pulmonary exam normal breath sounds clear to auscultation       Cardiovascular Exercise Tolerance: Good hypertension, (-) angina(-) Past MI and (-) Cardiac Stents Normal cardiovascular exam(-) dysrhythmias (-) Valvular Problems/Murmurs Rhythm:regular Rate:Normal     Neuro/Psych negative neurological ROS  negative psych ROS   GI/Hepatic negative GI ROS, Neg liver ROS,   Endo/Other  negative endocrine ROS  Renal/GU negative Renal ROS  negative genitourinary   Musculoskeletal   Abdominal   Peds  Hematology negative hematology ROS (+)   Anesthesia Other Findings Past Medical History: No date: Hypertension   Reproductive/Obstetrics negative OB ROS                             Anesthesia Physical Anesthesia Plan  ASA: II  Anesthesia Plan: General   Post-op Pain Management:    Induction: Intravenous, Rapid sequence and Cricoid pressure planned  PONV Risk Score and Plan: 2 and Ondansetron, Dexamethasone and Treatment may vary due to age or medical condition  Airway Management Planned: Oral ETT  Additional Equipment:   Intra-op Plan:   Post-operative Plan: Extubation in OR  Informed Consent: I have reviewed the patients History and Physical, chart, labs and discussed the procedure including the risks, benefits and alternatives for the proposed anesthesia with the patient or authorized  representative who has indicated his/her understanding and acceptance.     Dental Advisory Given  Plan Discussed with: Anesthesiologist, CRNA and Surgeon  Anesthesia Plan Comments:         Anesthesia Quick Evaluation

## 2020-06-22 NOTE — Transfer of Care (Signed)
Immediate Anesthesia Transfer of Care Note  Patient: Jacob Gibson  Procedure(s) Performed: XI ROBOTIC ASSISTED VENTRAL HERNIA (N/A )  Patient Location: PACU  Anesthesia Type:General  Level of Consciousness: sedated and patient cooperative  Airway & Oxygen Therapy: Patient Spontanous Breathing and Patient connected to face mask oxygen  Post-op Assessment: Report given to RN and Post -op Vital signs reviewed and stable  Post vital signs: Reviewed and stable  Last Vitals:  Vitals Value Taken Time  BP 130/74 06/22/20 2308  Temp    Pulse 76 06/22/20 2311  Resp 20 06/22/20 2311  SpO2 100 % 06/22/20 2311  Vitals shown include unvalidated device data.  Last Pain:  Vitals:   06/22/20 1710  TempSrc: Temporal  PainSc: 7       Patients Stated Pain Goal: 0 (06/22/20 1710)  Complications: No complications documented.

## 2020-06-22 NOTE — Progress Notes (Signed)
Patient taken over to PACU from sds; report given to [place], [place] from Mariea Clonts. RN

## 2020-06-22 NOTE — Patient Instructions (Signed)
Go straight to the medical mall and get registered for surgery as instructed.

## 2020-06-22 NOTE — Anesthesia Procedure Notes (Signed)
Procedure Name: Intubation Date/Time: 06/22/2020 9:15 PM Performed by: Waldo Laine, CRNA Pre-anesthesia Checklist: Patient identified, Patient being monitored, Timeout performed, Emergency Drugs available and Suction available Patient Re-evaluated:Patient Re-evaluated prior to induction Oxygen Delivery Method: Circle system utilized Preoxygenation: Pre-oxygenation with 100% oxygen Induction Type: IV induction, Rapid sequence and Cricoid Pressure applied Laryngoscope Size: 3 and McGraph Grade View: Grade I Tube type: Oral Tube size: 7.5 mm Number of attempts: 1 Airway Equipment and Method: Stylet Placement Confirmation: ETT inserted through vocal cords under direct vision,  positive ETCO2 and breath sounds checked- equal and bilateral Secured at: 21 cm Tube secured with: Tape Dental Injury: Teeth and Oropharynx as per pre-operative assessment

## 2020-06-22 NOTE — H&P (Signed)
CC: Immediate recurrence of umbilical hernia on arising this morning.  HISTORY OF PRESENT ILLNESS (HPI):  74 y.o. male  was seen in [facility] ED last night for evaluation of abdominal pain with associated incarcerated umbilical hernia and small bowel obstruction.  He experienced acute worsening of his known umbilical hernia discomfort to the point of pain without known precipitating factor.  He felt some significant bloating, and associated nausea.   He does endorse an episode of non-bloody, nonbilious emesis Surgery is consulted by ED physician Dr. Antoine Primas in this context for evaluation and management incarcerated umbilical hernia, with CT evidence of SBO.  He has a prior history of inguinal hernia repairs.  His hernia was completely reduced prior to discharge from the ER yesterday evening.  Patient elected not to proceed with surgery at that time, however this morning upon arising his hernia immediately recurred and he was unable to get it reduced.  For this he followed up immediately in the office seen earlier this afternoon.  Unfortunately I was not able to get it completely reduced despite multiple attempts.  He is willing to proceed with surgery at this time.  PAST MEDICAL HISTORY (PMH):  History reviewed. No pertinent past medical history.   PAST SURGICAL HISTORY [facility]):  Bilateral inguinal hernia repair  MEDICATIONS:  Prior to Admission medications   Not on File     ALLERGIES:  Not on File   SOCIAL HISTORY:  Social History        Socioeconomic History  . Marital status: Unknown    Spouse name: Not on file  . Number of children: Not on file  . Years of education: Not on file  . Highest education level: Not on file  Occupational History  . Not on file  Tobacco Use  . Smoking status: Not on file  Substance and Sexual Activity  . Alcohol use: Not on file  . Drug use: Not on file  . Sexual activity: Not on file  Other Topics Concern  . Not on file  Social  History Narrative  . Not on file   Social Determinants of Health      Financial Resource Strain:   . Difficulty of Paying Living Expenses: Not on file  Food Insecurity:   . Worried About Programme researcher, broadcasting/film/video in the Last Year: Not on file  . Ran Out of Food in the Last Year: Not on file  Transportation Needs:   . Lack of Transportation (Medical): Not on file  . Lack of Transportation (Non-Medical): Not on file  Physical Activity:   . Days of Exercise per Week: Not on file  . Minutes of Exercise per Session: Not on file  Stress:   . Feeling of Stress : Not on file  Social Connections:   . Frequency of Communication with Friends and Family: Not on file  . Frequency of Social Gatherings with Friends and Family: Not on file  . Attends Religious Services: Not on file  . Active Member of Clubs or Organizations: Not on file  . Attends Banker Meetings: Not on file  . Marital Status: Not on file  Intimate Partner Violence:   . Fear of Current or Ex-Partner: Not on file  . Emotionally Abused: Not on file  . Physically Abused: Not on file  . Sexually Abused: Not on file     FAMILY HISTORY:  No family history on file.    REVIEW OF SYSTEMS:  Constitutional: Negative forchillsand fever.  HENT: Negative  forsore throat.  Eyes: Negative forpain.  Respiratory: Negative forcoughand stridor.  Cardiovascular: Negative forchest pain.  Gastrointestinal: Positive forabdominal pain,nauseaand vomiting.  Genitourinary: Negative fordysuria.  Musculoskeletal: Negative formyalgias.  Skin: Negative forrash.  Neurological: Negative forseizures,loss of consciousnessand headaches.  Psychiatric/Behavioral: Negative forsuicidal ideas.  VITAL SIGNS:  Temp:  [98.4 F (36.9 C)] 98.4 F (36.9 C) (11/01 1203) Pulse Rate:  [93] 93 (11/01 1203) Resp:  [20] 20 (11/01 1203) BP: (192)/(87) 192/87 (11/01 1203) SpO2:  [96 %] 96 % (11/01 1203) Weight:  [96.6 kg] 96.6 kg  (11/01 1204)     Height: 5\' 11"  (180.3 cm) Weight: 96.6 kg BMI (Calculated): 29.72   INTAKE/OUTPUT:  No intake/output data recorded.  PHYSICAL EXAM:  Physical Exam Blood pressure (!) 192/87, pulse 93, temperature 98.4 F (36.9 C), temperature source Oral, resp. rate 20, height 5\' 11"  (1.803 m), weight 96.6 kg, SpO2 96 %. Last Weight  Most recent update: 06/21/2020 12:04 PM           Weight  96.6 kg (213 lb)             CONSTITUTIONAL: Well developed, and nourished, appropriately responsive and aware without distress.   EYES: Sclera non-icteric.   EARS, NOSE, MOUTH AND THROAT: Mask worn.     Hearing is intact to voice.  NECK: Trachea is midline, and there is no jugular venous distension.  LYMPH NODES:  Lymph nodes in the neck are not enlarged. RESPIRATORY:  Lungs are clear, and breath sounds are equal bilaterally. Normal respiratory effort without pathologic use of accessory muscles. CARDIOVASCULAR: Heart is regular in rate and rhythm. GI: The abdomen is soft, nontender, and nondistended.  He has a large 4 to 5 cm raised umbilicus consistent with an incarcerated hernia, history of partial reduction was reported.  I was able to complete the reduction without causing remarkable pain or distress.  I was able to then palpate the size of the fascial defect.  There were no palpable masses. I did not appreciate hepatosplenomegaly. There were normal bowel sounds. MUSCULOSKELETAL:  Symmetrical muscle tone appreciated in all four extremities.    SKIN: Skin turgor is normal. No pathologic skin lesions appreciated.  NEUROLOGIC:  Motor and sensation appear grossly normal.  No gross skeletal defects.  Cranial nerves are grossly nonfocal.  PSYCH:  Alert and oriented to person, place and time. Affect is appropriate for situation.  Data Reviewed I have personally reviewed what is currently available of the patient's imaging, recent labs and medical records.    Labs:  CBC Latest Ref Rng &  Units 06/21/2020  WBC 4.0 - 10.5 K/uL 13.5(H)  Hemoglobin 13.0 - 17.0 g/dL 13/08/2019  Hematocrit 39 - 52 % 47.9  Platelets 150 - 400 K/uL 298   CMP Latest Ref Rng & Units 06/21/2020  Glucose 70 - 99 mg/dL 89.1)  BUN 8 - 23 mg/dL 15  Creatinine 13/08/2019 - 694(H mg/dL 0.38  Sodium 8.82 - 8.00 mmol/L 138  Potassium 3.5 - 5.1 mmol/L 3.8  Chloride 98 - 111 mmol/L 98  CO2 22 - 32 mmol/L 28  Calcium 8.9 - 10.3 mg/dL 9.9  Total Protein 6.5 - 8.1 g/dL 8.1  Total Bilirubin 0.3 - 1.2 mg/dL 349)  Alkaline Phos 38 - 126 U/L 72  AST 15 - 41 U/L 17  ALT 0 - 44 U/L 18     Imaging studies:  I reviewed the CT images personally.  I concur with that noted below. Last 24 hrs: CT ABDOMEN PELVIS W  CONTRAST  Result Date: 06/21/2020 CLINICAL DATA:  Abdominal pain since yesterday, nausea and vomiting, supraumbilical pain EXAM: CT ABDOMEN AND PELVIS WITH CONTRAST TECHNIQUE: Multidetector CT imaging of the abdomen and pelvis was performed using the standard protocol following bolus administration of intravenous contrast. CONTRAST:  OMNIPAQUE IOHEXOL 300 MG/ML  SOLN COMPARISON:  None. FINDINGS: Lower chest: No acute abnormality. Hepatobiliary: Calcified gallstones are identified without cholecystitis. The liver is grossly unremarkable. No intrahepatic duct dilation. Pancreas: Cystic dilatation of the pancreatic duct is seen within the head and proximal body, measuring up to 11 mm, nonspecific. No focal pancreatic parenchymal abnormalities. Spleen: Normal in size without focal abnormality. Adrenals/Urinary Tract: Bilateral simple appearing renal cysts are noted. Otherwise the kidneys enhance normally and symmetrically. The adrenals are unremarkable. Bladder is moderately distended, with no focal abnormality. Stomach/Bowel: There is an umbilical hernia containing a segment of distal jejunum, abdominal wall defect measuring 3.1 cm. This results in high-grade small bowel obstruction, with dilatation of the proximal small  bowel measuring up to 3.4 cm. Distal small bowel decompressed. Scattered diverticulosis of the colon without diverticulitis. There is prominent mural thickening at the gastroesophageal junction, incompletely evaluated on this study. This measures up to 10 mm in thickness, and is circumferential at the GE junction and gastric cardia. Endoscopy is recommended to exclude neoplasm. Vascular/Lymphatic: Aortic atherosclerosis. No enlarged abdominal or pelvic lymph nodes. Reproductive: Prostate is heterogeneous and enlarged, measuring 7.5 by 7.0 x 8.2 cm, with mass effect upon the inferior aspect of the bladder. Other: No free fluid or free gas. Musculoskeletal: No acute or destructive bony lesions. Reconstructed images demonstrate multilevel lumbar spondylosis. IMPRESSION: 1. High-grade small-bowel obstruction at the level the mid jejunum, due to segmental herniation of jejunum within an umbilical hernia. Fat stranding within the umbilical hernia sac suggests incarceration. No bowel wall ischemia. 2. Mural thickening at the gastroesophageal junction/gastric cardia, for which endoscopy is recommended to exclude underlying neoplasm. 3. Cholelithiasis without cholecystitis. 4. Marked enlargement of the prostate. 5.  Aortic Atherosclerosis (ICD10-I70.0). Electronically Signed   By: Sharlet Salina M.D.   On: 06/21/2020 16:08     Assessment/Plan:  74 y.o. male with h/o small bowel obstruction secondary to incarcerated umbilical hernia, recurrent and unable to be completely reduced.  Robotic incarcerated ventral hernia repair with mesh.                         - I discussed possibility of incarceration, strangulation, enlargement in size over time, and the risk of emergency surgery in the face of strangulation.  Also discussed the risk of surgery including recurrence which can be up to 30% in the case of complex hernias, use of prosthetic materials (mesh) and the increased risk of infxn, post-op infxn and the possible  need for re-operation and removal of mesh if used, possibility of post-op SBO or ileus, and the risks of general anesthetic including MI, CVA, sudden death or even reaction to anesthetic medications. The patient and family understands the risks, any and all questions were answered to the patient's satisfaction.    All of the above findings and recommendations were discussed with the patient, and all of patient's questions were answered to his expressed satisfaction.   Campbell Lerner, M.D., [facility]  06/22/2020 ; 8:42 PM

## 2020-06-23 ENCOUNTER — Encounter: Payer: Self-pay | Admitting: Surgery

## 2020-06-23 DIAGNOSIS — Z20822 Contact with and (suspected) exposure to covid-19: Secondary | ICD-10-CM | POA: Diagnosis present

## 2020-06-23 DIAGNOSIS — K436 Other and unspecified ventral hernia with obstruction, without gangrene: Secondary | ICD-10-CM | POA: Diagnosis present

## 2020-06-23 DIAGNOSIS — E861 Hypovolemia: Secondary | ICD-10-CM | POA: Diagnosis present

## 2020-06-23 DIAGNOSIS — N179 Acute kidney failure, unspecified: Secondary | ICD-10-CM | POA: Diagnosis not present

## 2020-06-23 DIAGNOSIS — K460 Unspecified abdominal hernia with obstruction, without gangrene: Secondary | ICD-10-CM | POA: Diagnosis present

## 2020-06-23 LAB — CBC
HCT: 39 % (ref 39.0–52.0)
Hemoglobin: 13 g/dL (ref 13.0–17.0)
MCH: 29.9 pg (ref 26.0–34.0)
MCHC: 33.3 g/dL (ref 30.0–36.0)
MCV: 89.7 fL (ref 80.0–100.0)
Platelets: 256 10*3/uL (ref 150–400)
RBC: 4.35 MIL/uL (ref 4.22–5.81)
RDW: 12.6 % (ref 11.5–15.5)
WBC: 10 10*3/uL (ref 4.0–10.5)
nRBC: 0 % (ref 0.0–0.2)

## 2020-06-23 LAB — BASIC METABOLIC PANEL
Anion gap: 15 (ref 5–15)
BUN: 53 mg/dL — ABNORMAL HIGH (ref 8–23)
CO2: 22 mmol/L (ref 22–32)
Calcium: 8.5 mg/dL — ABNORMAL LOW (ref 8.9–10.3)
Chloride: 98 mmol/L (ref 98–111)
Creatinine, Ser: 2.1 mg/dL — ABNORMAL HIGH (ref 0.61–1.24)
GFR, Estimated: 32 mL/min — ABNORMAL LOW (ref >60)
Glucose, Bld: 159 mg/dL — ABNORMAL HIGH (ref 70–99)
Potassium: 4.1 mmol/L (ref 3.5–5.1)
Sodium: 135 mmol/L (ref 135–145)

## 2020-06-23 MED ORDER — HEPARIN SODIUM (PORCINE) 5000 UNIT/ML IJ SOLN
5000.0000 [IU] | Freq: Three times a day (TID) | INTRAMUSCULAR | Status: DC
  Administered 2020-06-24: 5000 [IU] via SUBCUTANEOUS
  Filled 2020-06-23: qty 1

## 2020-06-23 MED ORDER — ONDANSETRON HCL 4 MG/2ML IJ SOLN: 4.0000 mg | Freq: Four times a day (QID) | INTRAMUSCULAR | Status: DC | PRN

## 2020-06-23 MED ORDER — PANTOPRAZOLE SODIUM 40 MG IV SOLR
40.0000 mg | Freq: Every day | INTRAVENOUS | Status: DC
  Administered 2020-06-23 (×2): 40 mg via INTRAVENOUS
  Filled 2020-06-23 (×2): qty 40

## 2020-06-23 MED ORDER — MORPHINE SULFATE (PF) 2 MG/ML IV SOLN
2.0000 mg | INTRAVENOUS | Status: DC | PRN
  Administered 2020-06-23 (×3): 2 mg via INTRAVENOUS
  Filled 2020-06-23 (×3): qty 1

## 2020-06-23 MED ORDER — HYDROCODONE-ACETAMINOPHEN 5-325 MG PO TABS
1.0000 | ORAL_TABLET | ORAL | Status: DC | PRN
  Administered 2020-06-23: 1 via ORAL
  Administered 2020-06-24: 2 via ORAL
  Filled 2020-06-23: qty 2
  Filled 2020-06-23: qty 1
  Filled 2020-06-23: qty 2

## 2020-06-23 MED ORDER — ONDANSETRON 4 MG PO TBDP: 4.0000 mg | ORAL_TABLET | Freq: Four times a day (QID) | ORAL | Status: DC | PRN

## 2020-06-23 MED ORDER — HEPARIN SODIUM (PORCINE) 5000 UNIT/ML IJ SOLN: 5000.0000 [IU] | Freq: Three times a day (TID) | INTRAMUSCULAR | Status: DC

## 2020-06-23 MED ORDER — CEFAZOLIN SODIUM-DEXTROSE 2-4 GM/100ML-% IV SOLN
2.0000 g | Freq: Three times a day (TID) | INTRAVENOUS | Status: AC
  Administered 2020-06-23: 2 g via INTRAVENOUS
  Filled 2020-06-23: qty 100

## 2020-06-23 MED ORDER — SODIUM CHLORIDE 0.9 % IV SOLN: INTRAVENOUS | Status: DC

## 2020-06-23 NOTE — Progress Notes (Signed)
Patient had 200 mL of bloody urine with blood clots. Patient stated that he has an enlarged prostate and sometimes his urine is bloody and then clears up in a couple of days.  Will continue to monitor.  Jacob Gibson

## 2020-06-23 NOTE — Care Management Obs Status (Signed)
MEDICARE OBSERVATION STATUS NOTIFICATION   Patient Details  Name: Jacob Gibson MRN: [id number] Date of Birth: [birth year]   Medicare Observation Status Notification Given:  Yes    Chapman Fitch, RN 06/23/2020, 2:11 PM

## 2020-06-23 NOTE — Progress Notes (Signed)
[facility] SURGICAL PROGRESS NOTE  Hospital Day(s): 1.   Post op day(s): 1 Day Post-Op.   Interval History:  Patient seen and examined no acute events or new complaints overnight.  Patient reports he is doing well. Having incisional soreness, worse with movement, controlled by pain regimen No fever, chills, nausea, emesis CBC is reassuring as he is without leukocytosis nor anemia He does appear to have AKI with sCr - 2.10, UO - 200 ccs No significant electrolyte derangements  He has been on CLD post-operatively He is passing flatus  Vital signs in last 24 hours: [min-max] current  Temp:  [96.9 F (36.1 C)-98.6 F (37 C)] 98.3 F (36.8 C) (11/03 0251) Pulse Rate:  [63-86] 85 (11/03 0251) Resp:  [12-20] 19 (11/03 0251) BP: (100-136)/(41-74) 106/73 (11/03 0251) SpO2:  [92 %-100 %] 93 % (11/03 0251) Weight:  [97.1 kg-97.6 kg] 97.1 kg (11/02 1710)     Height: 5\' 11"  (180.3 cm) Weight: 97.1 kg BMI (Calculated): 29.86   Intake/Output last 2 shifts:  11/02 0701 - 11/03 0700 In: 2186.7 [I.V.:2004.1; IV Piggyback:182.6] Out: 200 [Urine:200]   Physical Exam:  Constitutional: alert, cooperative and no distress  Respiratory: breathing non-labored at rest  Cardiovascular: regular rate and sinus rhythm  Gastrointestinal: Soft, incisional soreness, and non-distended, no rebound/guarding, abdominal binder in place Integumentary: Laparoscopic incisions are CDI with dermabond, no erythema or drainage   Labs:  CBC Latest Ref Rng & Units 06/23/2020 06/21/2020  WBC 4.0 - 10.5 K/uL 10.0 13.5(H)  Hemoglobin 13.0 - 17.0 g/dL 13/08/2019 27.7  Hematocrit 39 - 52 % 39.0 47.9  Platelets 150 - 400 K/uL 256 298   CMP Latest Ref Rng & Units 06/23/2020 06/21/2020  Glucose 70 - 99 mg/dL 13/08/2019) 235(T)  BUN 8 - 23 mg/dL 614(E) 15  Creatinine 31(V - 1.24 mg/dL 4.00) 8.67(Y  Sodium 1.95 - 145 mmol/L 135 138  Potassium 3.5 - 5.1 mmol/L 4.1 3.8  Chloride 98 - 111 mmol/L 98 98  CO2 22 - 32 mmol/L  22 28  Calcium 8.9 - 10.3 mg/dL 093) 9.9  Total Protein 6.5 - 8.1 g/dL - 8.1  Total Bilirubin 0.3 - 1.2 mg/dL - 1.7(H)  Alkaline Phos 38 - 126 U/L - 72  AST 15 - 41 U/L - 17  ALT 0 - 44 U/L - 18    Imaging studies: No new pertinent imaging studies   Assessment/Plan: 74 y.o. male doing well, with AKI secondary to likely hypovolemia, 1 Day Post-Op s/p robotic assisted laparoscopic ventral hernia repair for recurrent incarcerated ventral hernia   - Okay to ADAT   - continue aggressive IVF resuscitation; monitor renal function   - Monitor abdominal examination; on-going bowel function  - Pain control prn; Antiemetics prn  - continue abdominal binder  - Complete peri-operative ABx   - encouraged mobilization  - DVT prophylaxis   - Discharge Planning: Once renal function improved and tolerating diet he can be discharged home, hopefully in AM   All of the above findings and recommendations were discussed with the patient, patient's family (wife at bedside), and the medical team, and all of patient's and family's questions were answered to their expressed satisfaction.  -- 65, PA-C [facility] 06/23/2020, 7:39 AM [phone] M-F: 7am - 4pm

## 2020-06-23 NOTE — Progress Notes (Signed)
Umbilical hernia incarceration recurred upon arising this morning.  Has not tolerated much more than tea earlier today, denies nausea and vomiting but reports anorexia.  Wants to proceed with surgery.  See H&P completed for operation.

## 2020-06-23 NOTE — Progress Notes (Signed)
   06/23/20 1330  Clinical Encounter Type  Visited With Patient and family together  Visit Type Initial;Social support;Spiritual support  Referral From Nurse  Consult/Referral To Chaplain  Ch visited Pt per OR for AD. Pt was alert and excited to see me. I did an AD education with the Pt. Pt say he was getting ready to go home. Ch will follow up with Pt.

## 2020-06-23 NOTE — Care Management (Signed)
Patient listed as not having a PCP.  Patient states he is relatively new to [place] from [place].  Patient states that him and his wife have a list of accepting MDs and will be making an appointment.   TOC offered to scheduled him a new PCP appointment.  States that him and his wife will follow up.   I notified him that should he change his mind

## 2020-06-24 LAB — BASIC METABOLIC PANEL
Anion gap: 7 (ref 5–15)
BUN: 34 mg/dL — ABNORMAL HIGH (ref 8–23)
CO2: 24 mmol/L (ref 22–32)
Calcium: 8 mg/dL — ABNORMAL LOW (ref 8.9–10.3)
Chloride: 105 mmol/L (ref 98–111)
Creatinine, Ser: 1.19 mg/dL (ref 0.61–1.24)
GFR, Estimated: >60 mL/min (ref >60)
Glucose, Bld: 132 mg/dL — ABNORMAL HIGH (ref 70–99)
Potassium: 3.8 mmol/L (ref 3.5–5.1)
Sodium: 136 mmol/L (ref 135–145)

## 2020-06-24 LAB — CBC
HCT: 36.5 % — ABNORMAL LOW (ref 39.0–52.0)
Hemoglobin: 11.9 g/dL — ABNORMAL LOW (ref 13.0–17.0)
MCH: 29.5 pg (ref 26.0–34.0)
MCHC: 32.6 g/dL (ref 30.0–36.0)
MCV: 90.3 fL (ref 80.0–100.0)
Platelets: 241 10*3/uL (ref 150–400)
RBC: 4.04 MIL/uL — ABNORMAL LOW (ref 4.22–5.81)
RDW: 12.4 % (ref 11.5–15.5)
WBC: 5.4 10*3/uL (ref 4.0–10.5)
nRBC: 0 % (ref 0.0–0.2)

## 2020-06-24 MED ORDER — HYDROCODONE-ACETAMINOPHEN 5-325 MG PO TABS: 1.0000 | ORAL_TABLET | Freq: Four times a day (QID) | ORAL | 0 refills | Status: DC | PRN

## 2020-06-24 MED ORDER — IBUPROFEN 800 MG PO TABS: 800.0000 mg | ORAL_TABLET | Freq: Three times a day (TID) | ORAL | 0 refills | Status: DC | PRN

## 2020-06-24 NOTE — Discharge Summary (Signed)
[facility] SURGICAL DISCHARGE SUMMARY  Patient ID: Jacob Gibson MRN: [id number] DOB/AGE: 75-Oct-1947 75 y.o.  Admit date: 06/22/2020 Discharge date: 06/24/2020  Discharge Diagnoses Patient Active Problem List   Diagnosis Date Noted   Incarcerated hernia 06/23/2020   Umbilical hernia with obstruction, without gangrene 06/22/2020    Consultants None   Procedures 06/22/2020: Robotic assisted laparoscopic incarcerated ventral hernia with small bowel obstruction, repairIPOM using oval ventralight BARD mesh   HPI: 74 y.o.male was seen in [facility] ED last night for evaluation of abdominal pain with associated incarcerated umbilical hernia and small bowel obstruction.He experienced acute worsening of his known umbilical hernia discomfort to the point of pain without known precipitating factor. He felt some significant bloating, and associated nausea. Hedoes endorse an episode of non-bloody,nonbilious emesisSurgery is consulted byEDphysician Dr. Earna Coder Smithin this context for evaluation and managementincarcerated umbilical hernia, with CT evidence of SBO. He has a prior history of inguinal hernia repairs. His hernia was completely reduced prior to discharge from the ER yesterday evening.  Patient elected not to proceed with surgery at that time, however this morning upon arising his hernia immediately recurred and he was unable to get it reduced.  For this he followed up immediately in the office seen earlier this afternoon. Unfortunately I was not able to get it completely reduced despite multiple attempts. He is willing to proceed with surgery at this time.  Hospital Course: Informed consent was obtained and documented, and patient underwent uneventful robotic assisted ventral hernia repair (Dr Claudine Mouton, 06/22/2020).  Post-operatively, patient did have AKI that required IVF resuscitation. On POD2, this had normalized. Advancement of patient's diet and  ambulation were well-tolerated. The remainder of patient's hospital course was essentially unremarkable, and discharge planning was initiated accordingly with patient safely able to be discharged home with appropriate discharge instructions, pain control, and outpatient follow-up after all of his questions were answered to his expressed satisfaction.   Discharge Condition: Good   Physical Examination:  Constitutional: alert, cooperative and no distress  Respiratory: breathing non-labored at rest  Cardiovascular: regular rate and sinus rhythm  Gastrointestinal: Soft, incisional soreness, and non-distended, no rebound/guarding, abdominal binder in place Integumentary: Laparoscopic incisions are CDI with dermabond, no erythema or drainage    Allergies as of 06/24/2020   No Known Allergies     Medication List    TAKE these medications   amLODipine 10 MG tablet Commonly known as: NORVASC Take 10 mg by mouth daily.   aspirin 81 MG chewable tablet by Mouth/Throat route   cilostazol 100 MG tablet Commonly known as: PLETAL Take 1 tablet by mouth 2 (two) times daily.   finasteride 5 MG tablet Commonly known as: PROSCAR Take by mouth.   HYDROcodone-acetaminophen 5-325 MG tablet Commonly known as: NORCO/VICODIN Take 1 tablet by mouth every 6 (six) hours as needed for moderate pain.   ibuprofen 800 MG tablet Commonly known as: ADVIL Take 1 tablet (800 mg total) by mouth every 8 (eight) hours as needed.   labetalol 200 MG tablet Commonly known as: NORMODYNE Take 200 mg by mouth 2 (two) times daily.   losartan 100 MG tablet Commonly known as: COZAAR Take 100 mg by mouth daily.   valsartan 320 MG tablet Commonly known as: DIOVAN by Mouth/Throat route         Follow-up Information    Campbell Lerner, MD. Schedule an appointment as soon as possible for a visit in 2 week(s).   Specialty: General Surgery Why: s/p ventral hernia repair  Contact information: [address] [place] [address] [phone]                Time spent on discharge management including discussion of hospital course, clinical condition, outpatient instructions, prescriptions, and follow up with the patient and members of the medical team: >30 minutes  -- Lynden Oxford , PA-C [facility]  06/24/2020, 11:26 AM [phone] M-F: 7am - 4pm

## 2020-06-24 NOTE — Discharge Instructions (Signed)
In addition to included general post-operative instructions for ventral hernia repair,  Diet: Resume home diet.   Activity: No heavy lifting >20 pounds (children, pets, laundry, garbage) for 6 weeks, but light activity and walking are encouraged. Do not drive or drink alcohol if taking narcotic pain medications or having pain that might distract from driving. Continue to wear abdominal binder for comfort and support as needed.  Wound care: You may shower/get incision wet with soapy water and pat dry (do not rub incisions), but no baths or submerging incision underwater until follow-up.   Medications: Resume all home medications. For mild to moderate pain: acetaminophen (Tylenol) or ibuprofen/naproxen (if no kidney disease). Combining Tylenol with alcohol can substantially increase your risk of causing liver disease. Narcotic pain medications, if prescribed, can be used for severe pain, though may cause nausea, constipation, and drowsiness. Do not combine Tylenol and Percocet (or similar) within a 6 hour period as Percocet (and similar) contain(s) Tylenol. If you do not need the narcotic pain medication, you do not need to fill the prescription.  Call office [phone] / [phone]) at any time if any questions, worsening pain, fevers/chills, bleeding, drainage from incision site, or other concerns.

## 2020-06-25 NOTE — Anesthesia Postprocedure Evaluation (Signed)
Anesthesia Post Note  Patient: Jacob Gibson  Procedure(s) Performed: XI ROBOTIC ASSISTED VENTRAL HERNIA (N/A )  Patient location during evaluation: PACU Anesthesia Type: General Level of consciousness: awake and alert Pain management: pain level controlled Vital Signs Assessment: post-procedure vital signs reviewed and stable Respiratory status: spontaneous breathing, nonlabored ventilation, respiratory function stable and patient connected to nasal cannula oxygen Cardiovascular status: blood pressure returned to baseline and stable Postop Assessment: no apparent nausea or vomiting Anesthetic complications: no   No complications documented.   Last Vitals:  Vitals:   06/24/20 0749 06/24/20 1137  BP: (!) 146/73 (!) 141/71  Pulse: 80 72  Resp: 20 20  Temp: 37.2 C 37.1 C  SpO2: 96% 93%    Last Pain:  Vitals:   06/24/20 1137  TempSrc: Oral  PainSc:                  Lenard Simmer

## 2020-07-06 ENCOUNTER — Ambulatory Visit (INDEPENDENT_AMBULATORY_CARE_PROVIDER_SITE_OTHER): Payer: Medicare Other | Admitting: Surgery

## 2020-07-06 ENCOUNTER — Encounter: Payer: Self-pay | Admitting: Surgery

## 2020-07-06 ENCOUNTER — Other Ambulatory Visit: Payer: Self-pay

## 2020-07-06 VITALS — BP 171/87 | HR 97 | Temp 97.8°F | Ht 71.0 in | Wt 210.2 lb

## 2020-07-06 DIAGNOSIS — Z8719 Personal history of other diseases of the digestive system: Secondary | ICD-10-CM | POA: Insufficient documentation

## 2020-07-06 DIAGNOSIS — Z9889 Other specified postprocedural states: Secondary | ICD-10-CM

## 2020-07-06 NOTE — Progress Notes (Signed)
[facility] POST-OP OFFICE VISIT  07/06/2020  HPI: Jacob Gibson is a 74 y.o. male 14 days s/p robotic assisted ventral hernia repair with mesh.  He reports normal bowel movements.  Denies any pain.  Denies nausea, vomiting, fevers and chills.  He reports the incision that week to bit of drainage postop, stopped after a couple days.  Having no other problems with any incision.  Vital signs: BP (!) 171/87   Pulse 97   Temp 97.8 F (36.6 C) (Oral)   Ht 5\' 11"  (1.803 m)   Wt 210 lb 3.2 oz (95.3 kg)   SpO2 97%   BMI 29.32 kg/m    Physical Exam: Constitutional: Appears well, no distress pleasant and bright in affect. Abdomen: Wearing binder, soft and nontender. Skin: All incisions clean dry and intact, umbilicus is now imbricated.  Assessment/Plan: This is a 74 y.o. male 14 days s/p robotic ventral hernia repair.  Patient Active Problem List   Diagnosis Date Noted  . Incarcerated hernia 06/23/2020  . Umbilical hernia with obstruction, without gangrene 06/22/2020    -We discussed resumption of his usual activities, deferring for complete healing over the next month.  Believe he understands and will defer any type of exertion until this interval was complete and will gradually ramp up as able.  We will be glad to see him again as needed.   13/09/2019 M.D., FACS 07/06/2020, 10:21 AM

## 2020-07-06 NOTE — Patient Instructions (Signed)
Get fiber into diet at least twice a day. No strenuous activity for 2 months. After that, you can resume your everyday activities. If you have any other concerns, feel free to contact our office.    Fiber Content in Foods  See the following list for the dietary fiber content of some common foods. High-fiber foods High-fiber foods contain 4 grams or more (4g or more) of fiber per serving. They include:  Artichoke (fresh) -- 1 medium has 10.3g of fiber.  Baked beans, plain or vegetarian (canned) --  cup has 5.2g of fiber.  Blackberries or raspberries (fresh) --  cup has 4g of fiber.  Bran cereal --  cup has 8.6g of fiber.  Bulgur (cooked) --  cup has 4g of fiber.  Kidney beans (canned) --  cup has 6.8g of fiber.  Lentils (cooked) --  cup has 7.8g of fiber.  Pear (fresh) -- 1 medium has 5.1g of fiber.  Peas (frozen) --  cup has 4.4g of fiber.  Pinto beans (canned) --  cup has 5.5g of fiber.  Pinto beans (dried and cooked) --  cup has 7.7g of fiber.  Potato with skin (baked) -- 1 medium has 4.4g of fiber.  Quinoa (cooked) --  cup has 5g of fiber.  Soybeans (canned, frozen, or fresh) --  cup has 5.1g of fiber. Moderate-fiber foods Moderate-fiber foods contain 1-4 grams (1-4g) of fiber per serving. They include:  Almonds -- 1 oz. has 3.5g of fiber.  Apple with skin -- 1 medium has 3.3g of fiber.  Applesauce, sweetened --  cup has 1.5g of fiber.  Bagel, plain -- one 4-inch (10-cm) bagel has 2g of fiber.  Banana -- 1 medium has 3.1g of fiber.  Broccoli (cooked) --  cup has 2.5g of fiber.  Carrots (cooked) --  cup has 2.3g of fiber.  Corn (canned or frozen) --  cup has 2.1g of fiber.  Corn tortilla -- one 6-inch (15-cm) tortilla has 1.5g of fiber.  Green beans (canned) --  cup has 2g of fiber.  Instant oatmeal --  cup has about 2g of fiber.  Long-grain brown rice (cooked) -- 1 cup has 3.5g of fiber.  Macaroni, enriched (cooked) -- 1 cup has 2.5g  of fiber.  Melon -- 1 cup has 1.4g of fiber.  Multigrain cereal --  cup has about 2-4g of fiber.  Orange -- 1 small has 3.1g of fiber.  Potatoes, mashed --  cup has 1.6g of fiber.  Raisins -- 1/4 cup has 1.6g of fiber.  Squash --  cup has 2.9g of fiber.  Sunflower seeds --  cup has 1.1g of fiber.  Tomato -- 1 medium has 1.5g of fiber.  Vegetable or soy patty -- 1 has 3.4g of fiber.  Whole-wheat bread -- 1 slice has 2g of fiber.  Whole-wheat spaghetti --  cup has 3.2g of fiber. Low-fiber foods Low-fiber foods contain less than 1 gram (less than 1g) of fiber per serving. They include:  Egg -- 1 large.  Flour tortilla -- one 6-inch (15-cm) tortilla.  Fruit juice --  cup.  Lettuce -- 1 cup.  Meat, poultry, or fish -- 1 oz.  Milk -- 1 cup.  Spinach (raw) -- 1 cup.  White bread -- 1 slice.  White rice --  cup.  Yogurt --  cup. Actual amounts of fiber in foods may be different depending on processing. Talk with your dietitian about how much fiber you need in your diet. This information is not intended  to replace advice given to you by your health care provider. Make sure you discuss any questions you have with your health care provider. Document Revised: 03/30/2016 Document Reviewed: 09/30/2015 Elsevier Patient Education  [place].

## 2021-05-20 ENCOUNTER — Other Ambulatory Visit (HOSPITAL_COMMUNITY): Payer: Self-pay | Admitting: Student

## 2021-05-20 ENCOUNTER — Other Ambulatory Visit: Payer: Self-pay | Admitting: Student

## 2021-05-20 DIAGNOSIS — M19012 Primary osteoarthritis, left shoulder: Secondary | ICD-10-CM

## 2021-05-20 DIAGNOSIS — M7582 Other shoulder lesions, left shoulder: Secondary | ICD-10-CM

## 2021-06-01 ENCOUNTER — Ambulatory Visit
Admission: RE | Admit: 2021-06-01 | Discharge: 2021-06-01 | Disposition: A | Discharge status: Home / Self Care | Payer: Medicare Other | Source: Ambulatory Visit | Attending: Student | Admitting: Student

## 2021-06-01 ENCOUNTER — Other Ambulatory Visit: Payer: Self-pay

## 2021-06-01 DIAGNOSIS — M19012 Primary osteoarthritis, left shoulder: Secondary | ICD-10-CM | POA: Insufficient documentation

## 2021-06-01 DIAGNOSIS — M7582 Other shoulder lesions, left shoulder: Secondary | ICD-10-CM | POA: Insufficient documentation

## 2021-07-22 ENCOUNTER — Other Ambulatory Visit: Payer: Self-pay | Admitting: Surgery

## 2021-08-02 ENCOUNTER — Other Ambulatory Visit: Payer: Self-pay | Admitting: Surgery

## 2021-08-02 ENCOUNTER — Encounter
Admission: RE | Admit: 2021-08-02 | Discharge: 2021-08-02 | Disposition: A | Discharge status: Home / Self Care | Payer: Medicare Other | Source: Ambulatory Visit | Attending: Surgery | Admitting: Surgery

## 2021-08-02 ENCOUNTER — Other Ambulatory Visit: Payer: Self-pay

## 2021-08-02 VITALS — Ht 70.0 in | Wt 210.0 lb

## 2021-08-02 DIAGNOSIS — Z01818 Encounter for other preprocedural examination: Secondary | ICD-10-CM | POA: Diagnosis not present

## 2021-08-02 DIAGNOSIS — I739 Peripheral vascular disease, unspecified: Secondary | ICD-10-CM

## 2021-08-02 DIAGNOSIS — I10 Essential (primary) hypertension: Secondary | ICD-10-CM

## 2021-08-02 HISTORY — DX: Peripheral vascular disease, unspecified: I73.9

## 2021-08-02 HISTORY — DX: Benign prostatic hyperplasia without lower urinary tract symptoms: N40.0

## 2021-08-02 LAB — CBC
HCT: 41.3 % (ref 39.0–52.0)
Hemoglobin: 13.8 g/dL (ref 13.0–17.0)
MCH: 29.8 pg (ref 26.0–34.0)
MCHC: 33.4 g/dL (ref 30.0–36.0)
MCV: 89.2 fL (ref 80.0–100.0)
Platelets: 240 10*3/uL (ref 150–400)
RBC: 4.63 MIL/uL (ref 4.22–5.81)
RDW: 12.2 % (ref 11.5–15.5)
WBC: 6.8 10*3/uL (ref 4.0–10.5)
nRBC: 0 % (ref 0.0–0.2)

## 2021-08-02 LAB — BASIC METABOLIC PANEL
Anion gap: 5 (ref 5–15)
BUN: 15 mg/dL (ref 8–23)
CO2: 28 mmol/L (ref 22–32)
Calcium: 8.8 mg/dL — ABNORMAL LOW (ref 8.9–10.3)
Chloride: 107 mmol/L (ref 98–111)
Creatinine, Ser: 0.98 mg/dL (ref 0.61–1.24)
GFR, Estimated: >60 mL/min (ref >60)
Glucose, Bld: 92 mg/dL (ref 70–99)
Potassium: 3.5 mmol/L (ref 3.5–5.1)
Sodium: 140 mmol/L (ref 135–145)

## 2021-08-02 NOTE — Patient Instructions (Signed)
Your procedure is scheduled on: 08/11/21 Report to DAY SURGERY DEPARTMENT LOCATED ON 2ND FLOOR MEDICAL MALL ENTRANCE. To find out your arrival time please call [phone] between 1PM - 3PM on 08/10/21.  Remember: Instructions that are not followed completely may result in serious medical risk, up to and including death, or upon the discretion of your surgeon and anesthesiologist your surgery may need to be rescheduled.     _X__ 1. Do not eat food after midnight the night before your procedure.                 No gum chewing or hard candies. You may drink clear liquids up to 2 hours                 before you are scheduled to arrive for your surgery- DO not drink clear                 liquids within 2 hours of the start of your surgery.                 Clear Liquids include:  water, apple juice without pulp, clear carbohydrate                 drink such as Clearfast or Gatorade, Black Coffee or Tea (Do not add                 anything to coffee or tea). Diabetics water only  Ensure "CLEAR" Pre Surgery drink should be completed 2 hours before arriving to surgery, than nothing else to drink after that.  __X__2.  On the morning of surgery brush your teeth with toothpaste and water, you                 may rinse your mouth with mouthwash if you wish.  Do not swallow any              toothpaste of mouthwash.     _X__ 3.  No Alcohol for 24 hours before or after surgery.   _X__ 4.  Do Not Smoke or use e-cigarettes For 24 Hours Prior to Your Surgery.                 Do not use any chewable tobacco products for at least 6 hours prior to                 surgery.  ____  5.  Bring all medications with you on the day of surgery if instructed.   __X__  6.  Notify your doctor if there is any change in your medical condition      (cold, fever, infections).     Do not wear jewelry, make-up, hairpins, clips or nail polish. Do not wear lotions, powders, or perfumes.  Do not shave body hair 48 hours  prior to surgery. Men may shave face and neck. Do not bring valuables to the hospital.    [facility] is not responsible for any belongings or valuables.  Contacts, dentures/partials or body piercings may not be worn into surgery. Bring a case for your contacts, glasses or hearing aids, a denture cup will be supplied. Leave your suitcase in the car. After surgery it may be brought to your room. For patients admitted to the hospital, discharge time is determined by your treatment team.   Patients discharged the day of surgery will not be allowed to drive home.   Please read over the following fact sheets  that you were given:   CHG soap, Incentive Spirometer, Ensure  __X__ Take these medicines the morning of surgery with A SIP OF WATER:    1. amLODipine (NORVASC) 10 MG tablet  2. finasteride (PROSCAR) 5 MG tablet  3. labetalol (NORMODYNE) 200 MG tablet  4.  5.  6.  ____ Fleet Enema (as directed)   __X__ Use CHG Soap/SAGE wipes as directed  ____ Use inhalers on the day of surgery  ____ Stop metformin/Janumet/Farxiga 2 days prior to surgery    ____ Take 1/2 of usual insulin dose the night before surgery. No insulin the morning          of surgery.   __X__ Stop Blood Thinner, PLETAL 5 DAYS PRIOR TO SURGERY (last dose will be 08/06/21) per Dr Joice Lofts.   __X__ Stop Anti-inflammatories 7 days before surgery such as Advil, Ibuprofen, Motrin,  BC or Goodies Powder, Naprosyn, Naproxen, Aleve, Aspirin   You may take Tylenol if needed  __X__ Stop all herbal supplements, fish oil or vitamin E until after surgery.    ____ Bring C-Pap to the hospital.

## 2021-08-11 ENCOUNTER — Encounter
Admission: RE | Disposition: A | Discharge status: Home / Self Care | Payer: Self-pay | Source: Home / Self Care | Attending: Surgery

## 2021-08-11 ENCOUNTER — Ambulatory Visit: Payer: Medicare Other

## 2021-08-11 ENCOUNTER — Ambulatory Visit: Payer: Medicare Other | Admitting: Certified Registered"

## 2021-08-11 ENCOUNTER — Ambulatory Visit
Admission: RE | Admit: 2021-08-11 | Discharge: 2021-08-11 | Disposition: A | Discharge status: Home / Self Care | Payer: Medicare Other | Attending: Surgery | Admitting: Surgery

## 2021-08-11 ENCOUNTER — Encounter: Payer: Self-pay | Admitting: Surgery

## 2021-08-11 ENCOUNTER — Ambulatory Visit: Payer: Medicare Other | Admitting: Urgent Care

## 2021-08-11 ENCOUNTER — Other Ambulatory Visit: Payer: Self-pay

## 2021-08-11 DIAGNOSIS — M25512 Pain in left shoulder: Secondary | ICD-10-CM

## 2021-08-11 DIAGNOSIS — M75122 Complete rotator cuff tear or rupture of left shoulder, not specified as traumatic: Secondary | ICD-10-CM | POA: Diagnosis not present

## 2021-08-11 DIAGNOSIS — M19012 Primary osteoarthritis, left shoulder: Secondary | ICD-10-CM | POA: Insufficient documentation

## 2021-08-11 DIAGNOSIS — I739 Peripheral vascular disease, unspecified: Secondary | ICD-10-CM | POA: Insufficient documentation

## 2021-08-11 DIAGNOSIS — M65812 Other synovitis and tenosynovitis, left shoulder: Secondary | ICD-10-CM | POA: Insufficient documentation

## 2021-08-11 DIAGNOSIS — I10 Essential (primary) hypertension: Secondary | ICD-10-CM | POA: Diagnosis not present

## 2021-08-11 HISTORY — PX: SHOULDER ARTHROSCOPY WITH SUBACROMIAL DECOMPRESSION, ROTATOR CUFF REPAIR AND BICEP TENDON REPAIR: SHX5687

## 2021-08-11 SURGERY — SHOULDER ARTHROSCOPY WITH SUBACROMIAL DECOMPRESSION, ROTATOR CUFF REPAIR AND BICEP TENDON REPAIR
Anesthesia: General | Site: Shoulder | Laterality: Left

## 2021-08-11 MED ORDER — METOCLOPRAMIDE HCL 5 MG/ML IJ SOLN: 5.0000 mg | Freq: Three times a day (TID) | INTRAMUSCULAR | Status: DC | PRN

## 2021-08-11 MED ORDER — FENTANYL CITRATE PF 50 MCG/ML IJ SOSY: 50.0000 ug | PREFILLED_SYRINGE | Freq: Once | INTRAMUSCULAR | Status: AC

## 2021-08-11 MED ORDER — MIDAZOLAM HCL 2 MG/2ML IJ SOLN
INTRAMUSCULAR | Status: AC
  Administered 2021-08-11: 07:00:00 1 mg via INTRAVENOUS
  Filled 2021-08-11: qty 2

## 2021-08-11 MED ORDER — DEXAMETHASONE SODIUM PHOSPHATE 10 MG/ML IJ SOLN
INTRAMUSCULAR | Status: DC | PRN
  Administered 2021-08-11: 10 mg via INTRAVENOUS

## 2021-08-11 MED ORDER — PHENYLEPHRINE HCL-NACL 20-0.9 MG/250ML-% IV SOLN
INTRAVENOUS | Status: DC | PRN
  Administered 2021-08-11: 20 ug/min via INTRAVENOUS

## 2021-08-11 MED ORDER — KETOROLAC TROMETHAMINE 15 MG/ML IJ SOLN
INTRAMUSCULAR | Status: AC
  Administered 2021-08-11: 10:00:00 15 mg via INTRAVENOUS
  Filled 2021-08-11: qty 1

## 2021-08-11 MED ORDER — PROPOFOL 1000 MG/100ML IV EMUL
INTRAVENOUS | Status: AC
  Filled 2021-08-11: qty 200

## 2021-08-11 MED ORDER — ROCURONIUM BROMIDE 100 MG/10ML IV SOLN
INTRAVENOUS | Status: DC | PRN
  Administered 2021-08-11: 10 mg via INTRAVENOUS
  Administered 2021-08-11: 50 mg via INTRAVENOUS
  Administered 2021-08-11: 20 mg via INTRAVENOUS

## 2021-08-11 MED ORDER — EPINEPHRINE PF 1 MG/ML IJ SOLN
INTRAMUSCULAR | Status: AC
  Filled 2021-08-11: qty 1

## 2021-08-11 MED ORDER — CHLORHEXIDINE GLUCONATE 0.12 % MT SOLN: 15.0000 mL | Freq: Once | OROMUCOSAL | Status: AC

## 2021-08-11 MED ORDER — LACTATED RINGERS IV SOLN
INTRAVENOUS | Status: DC | PRN
  Administered 2021-08-11 (×2): 3001 mL

## 2021-08-11 MED ORDER — FENTANYL CITRATE (PF) 100 MCG/2ML IJ SOLN
INTRAMUSCULAR | Status: AC
  Filled 2021-08-11: qty 2

## 2021-08-11 MED ORDER — KETOROLAC TROMETHAMINE 15 MG/ML IJ SOLN: 15.0000 mg | Freq: Once | INTRAMUSCULAR | Status: AC

## 2021-08-11 MED ORDER — BUPIVACAINE LIPOSOME 1.3 % IJ SUSP
INTRAMUSCULAR | Status: AC
  Filled 2021-08-11: qty 10

## 2021-08-11 MED ORDER — LIDOCAINE HCL (PF) 1 % IJ SOLN
INTRAMUSCULAR | Status: DC | PRN
  Administered 2021-08-11: 3 mL via SUBCUTANEOUS

## 2021-08-11 MED ORDER — BUPIVACAINE-EPINEPHRINE 0.5% -1:200000 IJ SOLN
INTRAMUSCULAR | Status: DC | PRN
  Administered 2021-08-11: 30 mL

## 2021-08-11 MED ORDER — ONDANSETRON HCL 4 MG/2ML IJ SOLN: 4.0000 mg | Freq: Four times a day (QID) | INTRAMUSCULAR | Status: DC | PRN

## 2021-08-11 MED ORDER — LACTATED RINGERS IV SOLN: INTRAVENOUS | Status: DC

## 2021-08-11 MED ORDER — GLYCOPYRROLATE 0.2 MG/ML IJ SOLN
INTRAMUSCULAR | Status: DC | PRN
  Administered 2021-08-11: .2 mg via INTRAVENOUS

## 2021-08-11 MED ORDER — FENTANYL CITRATE (PF) 100 MCG/2ML IJ SOLN
INTRAMUSCULAR | Status: DC | PRN
  Administered 2021-08-11: 50 ug via INTRAVENOUS

## 2021-08-11 MED ORDER — FAMOTIDINE 20 MG PO TABS
ORAL_TABLET | ORAL | Status: AC
  Administered 2021-08-11: 07:00:00 20 mg via ORAL
  Filled 2021-08-11: qty 1

## 2021-08-11 MED ORDER — BUPIVACAINE HCL (PF) 0.5 % IJ SOLN
INTRAMUSCULAR | Status: AC
  Filled 2021-08-11: qty 10

## 2021-08-11 MED ORDER — MIDAZOLAM HCL 2 MG/2ML IJ SOLN: 1.0000 mg | Freq: Once | INTRAMUSCULAR | Status: AC

## 2021-08-11 MED ORDER — PHENYLEPHRINE HCL (PRESSORS) 10 MG/ML IV SOLN
INTRAVENOUS | Status: DC | PRN
  Administered 2021-08-11: 80 ug via INTRAVENOUS

## 2021-08-11 MED ORDER — SUCCINYLCHOLINE CHLORIDE 200 MG/10ML IV SOSY
PREFILLED_SYRINGE | INTRAVENOUS | Status: DC | PRN
  Administered 2021-08-11: 100 mg via INTRAVENOUS

## 2021-08-11 MED ORDER — PHENYLEPHRINE HCL (PRESSORS) 10 MG/ML IV SOLN
INTRAVENOUS | Status: AC
  Filled 2021-08-11: qty 1

## 2021-08-11 MED ORDER — ONDANSETRON HCL 4 MG/2ML IJ SOLN
INTRAMUSCULAR | Status: DC | PRN
  Administered 2021-08-11 (×2): 4 mg via INTRAVENOUS

## 2021-08-11 MED ORDER — BUPIVACAINE LIPOSOME 1.3 % IJ SUSP
INTRAMUSCULAR | Status: DC | PRN
  Administered 2021-08-11: 10 mL via PERINEURAL

## 2021-08-11 MED ORDER — FENTANYL CITRATE (PF) 100 MCG/2ML IJ SOLN: 25.0000 ug | INTRAMUSCULAR | Status: DC | PRN

## 2021-08-11 MED ORDER — METOCLOPRAMIDE HCL 10 MG PO TABS: 5.0000 mg | ORAL_TABLET | Freq: Three times a day (TID) | ORAL | Status: DC | PRN

## 2021-08-11 MED ORDER — CHLORHEXIDINE GLUCONATE 0.12 % MT SOLN
OROMUCOSAL | Status: AC
  Administered 2021-08-11: 07:00:00 15 mL via OROMUCOSAL
  Filled 2021-08-11: qty 15

## 2021-08-11 MED ORDER — BUPIVACAINE HCL (PF) 0.5 % IJ SOLN
INTRAMUSCULAR | Status: DC | PRN
  Administered 2021-08-11: 20 mL via PERINEURAL

## 2021-08-11 MED ORDER — CEFAZOLIN SODIUM-DEXTROSE 2-4 GM/100ML-% IV SOLN
INTRAVENOUS | Status: AC
  Filled 2021-08-11: qty 100

## 2021-08-11 MED ORDER — SUGAMMADEX SODIUM 200 MG/2ML IV SOLN
INTRAVENOUS | Status: DC | PRN
  Administered 2021-08-11: 500 mg via INTRAVENOUS

## 2021-08-11 MED ORDER — ACETAMINOPHEN 10 MG/ML IV SOLN
INTRAVENOUS | Status: DC | PRN
  Administered 2021-08-11: 1000 mg via INTRAVENOUS

## 2021-08-11 MED ORDER — OXYCODONE HCL 5 MG PO TABS: 5.0000 mg | ORAL_TABLET | ORAL | Status: DC | PRN

## 2021-08-11 MED ORDER — MIDAZOLAM HCL 2 MG/2ML IJ SOLN
INTRAMUSCULAR | Status: AC
  Filled 2021-08-11: qty 2

## 2021-08-11 MED ORDER — ONDANSETRON HCL 4 MG/2ML IJ SOLN: 4.0000 mg | Freq: Once | INTRAMUSCULAR | Status: DC | PRN

## 2021-08-11 MED ORDER — ONDANSETRON HCL 4 MG PO TABS: 4.0000 mg | ORAL_TABLET | Freq: Four times a day (QID) | ORAL | Status: DC | PRN

## 2021-08-11 MED ORDER — FENTANYL CITRATE PF 50 MCG/ML IJ SOSY
PREFILLED_SYRINGE | INTRAMUSCULAR | Status: AC
  Administered 2021-08-11: 07:00:00 25 ug via INTRAVENOUS
  Filled 2021-08-11: qty 1

## 2021-08-11 MED ORDER — PROPOFOL 10 MG/ML IV BOLUS
INTRAVENOUS | Status: DC | PRN
  Administered 2021-08-11: 150 mg via INTRAVENOUS

## 2021-08-11 MED ORDER — ORAL CARE MOUTH RINSE: 15.0000 mL | Freq: Once | OROMUCOSAL | Status: AC

## 2021-08-11 MED ORDER — OXYCODONE HCL 5 MG PO TABS: 5.0000 mg | ORAL_TABLET | ORAL | 0 refills | Status: AC | PRN

## 2021-08-11 MED ORDER — FAMOTIDINE 20 MG PO TABS: 20.0000 mg | ORAL_TABLET | Freq: Once | ORAL | Status: AC

## 2021-08-11 MED ORDER — LIDOCAINE HCL (CARDIAC) PF 100 MG/5ML IV SOSY
PREFILLED_SYRINGE | INTRAVENOUS | Status: DC | PRN
  Administered 2021-08-11: 40 mg via INTRAVENOUS

## 2021-08-11 MED ORDER — CEFAZOLIN SODIUM-DEXTROSE 2-4 GM/100ML-% IV SOLN
2.0000 g | INTRAVENOUS | Status: AC
  Administered 2021-08-11: 08:00:00 2 g via INTRAVENOUS

## 2021-08-11 MED ORDER — BUPIVACAINE-EPINEPHRINE (PF) 0.5% -1:200000 IJ SOLN
INTRAMUSCULAR | Status: AC
  Filled 2021-08-11: qty 30

## 2021-08-11 MED ORDER — EPHEDRINE SULFATE 50 MG/ML IJ SOLN
INTRAMUSCULAR | Status: DC | PRN
  Administered 2021-08-11 (×2): 5 mg via INTRAVENOUS
  Administered 2021-08-11: 10 mg via INTRAVENOUS

## 2021-08-11 MED ORDER — LIDOCAINE HCL (PF) 1 % IJ SOLN
INTRAMUSCULAR | Status: AC
  Filled 2021-08-11: qty 5

## 2021-08-11 SURGICAL SUPPLY — 58 items
ANCHOR ALL-SUT Q-FIX 2.8 (Anchor) ×4 IMPLANT
ANCHOR HEALICOIL REGEN 5.5 (Anchor) ×4 IMPLANT
ANCHOR JUGGERKNOT WTAP NDL 2.9 (Anchor) ×2 IMPLANT
ANCHOR SUT W/ ORTHOCORD (Anchor) ×2 IMPLANT
BIT DRILL JUGRKNT W/NDL BIT2.9 (DRILL) IMPLANT
BLADE FULL RADIUS 3.5 (BLADE) ×3 IMPLANT
BUR ACROMIONIZER 4.0 (BURR) ×3 IMPLANT
CANNULA SHAVER 8MMX76MM (CANNULA) ×3 IMPLANT
CHLORAPREP W/TINT 26 (MISCELLANEOUS) ×3 IMPLANT
COVER MAYO STAND REUSABLE (DRAPES) ×3 IMPLANT
DILATOR 5.5 THREADED HEALICOIL (MISCELLANEOUS) ×2 IMPLANT
DRAPE IMP U-DRAPE 54X76 (DRAPES) ×6 IMPLANT
DRILL JUGGERKNOT W/NDL BIT 2.9 (DRILL) ×3
ELECT CAUTERY BLADE 6.4 (BLADE) ×3 IMPLANT
ELECT REM PT RETURN 9FT ADLT (ELECTROSURGICAL) ×3
ELECTRODE REM PT RTRN 9FT ADLT (ELECTROSURGICAL) ×1 IMPLANT
GAUZE SPONGE 4X4 12PLY STRL (GAUZE/BANDAGES/DRESSINGS) ×3 IMPLANT
GAUZE XEROFORM 1X8 LF (GAUZE/BANDAGES/DRESSINGS) ×3 IMPLANT
GLOVE SRG 8 PF TXTR STRL LF DI (GLOVE) ×1 IMPLANT
GLOVE SURG ENC MOIS LTX SZ7.5 (GLOVE) ×6 IMPLANT
GLOVE SURG ENC MOIS LTX SZ8 (GLOVE) ×6 IMPLANT
GLOVE SURG UNDER LTX SZ8 (GLOVE) ×3 IMPLANT
GLOVE SURG UNDER POLY LF SZ8 (GLOVE) ×2
GOWN STRL REUS W/ TWL LRG LVL3 (GOWN DISPOSABLE) ×1 IMPLANT
GOWN STRL REUS W/ TWL XL LVL3 (GOWN DISPOSABLE) ×1 IMPLANT
GOWN STRL REUS W/TWL LRG LVL3 (GOWN DISPOSABLE) ×2
GOWN STRL REUS W/TWL XL LVL3 (GOWN DISPOSABLE) ×2
GRAFT TISS 35X35 2 THK DERM (Tissue) IMPLANT
GRASPER SUT 15 45D LOW PRO (SUTURE) ×2 IMPLANT
IV LACTATED RINGER IRRG 3000ML (IV SOLUTION) ×4
IV LR IRRIG 3000ML ARTHROMATIC (IV SOLUTION) ×2 IMPLANT
KIT CANNULA 8X76-LX IN CANNULA (CANNULA) ×4 IMPLANT
KIT SUTURE 2.8 Q-FIX DISP (MISCELLANEOUS) ×2 IMPLANT
MANIFOLD NEPTUNE II (INSTRUMENTS) ×4 IMPLANT
MASK FACE SPIDER DISP (MASK) ×3 IMPLANT
MAT ABSORB  FLUID 56X50 GRAY (MISCELLANEOUS) ×2
MAT ABSORB FLUID 56X50 GRAY (MISCELLANEOUS) ×1 IMPLANT
PACK ARTHROSCOPY SHOULDER (MISCELLANEOUS) ×3 IMPLANT
PAD ABD DERMACEA PRESS 5X9 (GAUZE/BANDAGES/DRESSINGS) ×4 IMPLANT
PASSER SUT FIRSTPASS SELF (INSTRUMENTS) ×2 IMPLANT
SLING ARM LRG DEEP (SOFTGOODS) ×1 IMPLANT
SLING ULTRA II LG (MISCELLANEOUS) ×3 IMPLANT
SPONGE T-LAP 18X18 ~~LOC~~+RFID (SPONGE) ×3 IMPLANT
STAPLER SKIN PROX 35W (STAPLE) ×3 IMPLANT
STRAP SAFETY 5IN WIDE (MISCELLANEOUS) ×3 IMPLANT
SUT ETHIBOND 0 MO6 C/R (SUTURE) ×3 IMPLANT
SUT FIBERWIRE #2 38 BLUE 1/2 (SUTURE) ×3
SUT ULTRABRAID #2 38 (SUTURE) ×2 IMPLANT
SUT ULTRABRAID 2 COBRAID 38 (SUTURE) ×10 IMPLANT
SUT VIC AB 2-0 CT1 27 (SUTURE) ×4
SUT VIC AB 2-0 CT1 TAPERPNT 27 (SUTURE) ×2 IMPLANT
SUTURE FIBERWR #2 38 BLUE 1/2 (SUTURE) IMPLANT
TAPE MICROFOAM 4IN (TAPE) ×3 IMPLANT
TISSUE ARTHROFLEX DERMUS 35X35 (Tissue) ×3 IMPLANT
TUBING CONNECTING 10 (TUBING) ×2 IMPLANT
TUBING CONNECTING 10' (TUBING) ×1
TUBING INFLOW SET DBFLO PUMP (TUBING) ×3 IMPLANT
WAND WEREWOLF FLOW 90D (MISCELLANEOUS) ×3 IMPLANT

## 2021-08-11 NOTE — Op Note (Addendum)
08/11/2021  10:16 AM  Patient:   Jacob Gibson  Pre-Op Diagnosis:   Traumatic massive full-thickness rotator cuff tear, left shoulder.  Post-Op Diagnosis:   Massive full-thickness rotator cuff tear with extensive synovitis, degenerative labral fraying, and biceps tendinopathy, left shoulder.  Procedure:   Extensive arthroscopic debridement, arthroscopic subscapularis tendon repair, arthroscopic subacromial decompression, mini-open repair of supraspinatus and infraspinatus tendons with dermal allograft, and mini-open biceps tenodesis, left shoulder.  Anesthesia:   General endotracheal with interscalene block using Exparel placed preoperatively by the anesthesiologist.  Surgeon:   Maryagnes Amos, MD  Assistant:   Horris Latino, PA-C  Findings:   As above. There was extensive partial-thickness tearing involving the superior insertional fibers of the subscapularis tendon, full-thickness tearing of the entire supraspinatus tendon, and full-thickness tearing of the anterior portion of the infraspinatus tendon. There was moderate degenerative labral fraying superiorly and posterosuperiorly without frank detachment of the labrum from the glenoid rim. There was evidence of inflammation of the biceps tendon without partial or full-thickness tearing. There was significant synovitis anteriorly and superiorly. The articular surfaces of the glenoid and humerus both were in satisfactory condition.  Complications:   None  Fluids:   1000 cc  Estimated blood loss:   10 cc  Tourniquet time:   None  Drains:   None  Closure:   Staples      Brief clinical note:   The patient is is a 75 year old male with a long history of gradually worsening left shoulder pain.  His symptoms worsened dramatically about 4-5 months ago while working out at [employer]. The patient's symptoms have persisted despite medications, activity modification, etc. The patient's history and examination are consistent with a large  rotator cuff tear. These findings were confirmed by MRI scan. The patient presents at this time for definitive management of these shoulder symptoms.  Procedure:   The patient underwent placement of an interscalene block using Exparel by the anesthesiologist in the preoperative holding area before being brought into the operating room and lain in the supine position. The patient then underwent general endotracheal intubation and anesthesia before being repositioned in the beach chair position using the beach chair positioner. The left shoulder and upper extremity were prepped with ChloraPrep solution before being draped sterilely. Preoperative antibiotics were administered. A timeout was performed to confirm the proper surgical site before the expected portal sites and incision site were injected with 0.5% Sensorcaine with epinephrine.   A posterior portal was created and the glenohumeral joint thoroughly inspected with the findings as described above. An anterior portal was created using an outside-in technique. The labrum and rotator cuff were further probed, again confirming the above-noted findings. Areas of degenerative fraying of the labrum were debrided back to stable margins using the full-radius resector, as were the torn margins of the subscapularis and supraspinatus tendons. In addition, areas of extensive synovitis anteriorly, superiorly, and posterior superiorly also were debrided back using the full-radius resector. The ArthroCare wand was inserted and used to release the biceps tendon from its labral anchor.  It also was used to obtain hemostasis as well as to "anneal" the labrum superiorly and anteriorly.   Utilizing a separate superolateral portal, the exposed portion of the lesser tuberosity was roughened with the full-radius resector before the superior insertional fibers of the subscapularis tendon was repaired using a single Mitek bio knotless anchor. Subsequent probing and as well as  passive external rotation of the shoulder demonstrated excellent stability of the repair. The instruments  were removed from the joint after suctioning the excess fluid.  The camera was repositioned through the posterior portal into the subacromial space. A separate lateral portal was created using an outside-in technique. The 3.5 mm full-radius resector was introduced and used to perform a subtotal bursectomy. The ArthroCare wand was then inserted and used to remove the periosteal tissue off the undersurface of the anterior third of the acromion as well as to recess the coracoacromial ligament from its attachment along the anterior and lateral margins of the acromion. The 4.0 mm acromionizing bur was introduced and used to complete the decompression by removing the undersurface of the anterior third of the acromion. The full radius resector was reintroduced to remove any residual bony debris before the ArthroCare wand was reintroduced to obtain hemostasis. The instruments were then removed from the subacromial space after suctioning the excess fluid.  Incorporating the previous superolateral portal site, an approximately 4-5 cm incision was made over the anterolateral aspect of the shoulder beginning at the anterolateral corner of the acromion and extending distally in line with the bicipital groove. This incision was carried down through the subcutaneous tissues to expose the deltoid fascia. The raphae between the anterior and middle thirds was identified and this plane developed to provide access into the subacromial space. Additional bursal tissues were debrided sharply using Metzenbaum scissors. The rotator cuff tear was readily identified. The margins were debrided sharply with a #15 blade and the exposed greater tuberosity roughened with a rongeur. Despite extensive efforts at mobilization, the tendon could not be brought back to the footprint on the greater tuberosity. Therefore, it was elected to proceed  with using a dermal allograft patch.    The most medial/longitudinal portion of the tear was repaired using two #2 FiberWire was placed in a side-to-side fashion. The remainder of the tear was repaired using the dermal allograft.  Multiple #2 FiberWire sutures were passed through the margins of the rotator cuff using the [facility] FirstPass device. Again, using the FirstPass device, the sutures were passed through the graft which had been contoured to accommodate the shape of the remaining defect. The sutures were tied securely. Two [employer]hew 2.9 mm Q-Fix anchors were placed at the articular margin and each of these eight sutures passed through the dermal allograft using the FirstPass device, maintaining the appropriate order. Each of the sutures again were tied securely before the sutures were brought back laterally and secured using two [employer]hew Healicoil knotless RegeneSorb anchors. Several additional #2 FiberWire's were placed in a side-to-side fashion to complete the repair. An apparent watertight closure was obtained.  The bicipital groove was identified by palpation and opened for 1-1.5 cm. The biceps tendon stump was retrieved through this defect. The floor of the bicipital groove was roughened with a curet before a a single Biomet 2.9 mm JuggerKnot anchor was inserted. Both sets of sutures were passed through the biceps tendon and tied securely to effect the tenodesis. The bicipital sheath was reapproximated using two #0 Ethibond interrupted sutures, incorporating the biceps tendon to further reinforce the tenodesis.  The wound was copiously irrigated with sterile saline solution before the deltoid raphae was reapproximated using 2-0 Vicryl interrupted sutures. The subcutaneous tissues were closed in two layers using 2-0 Vicryl interrupted sutures before the skin was closed using staples. The portal sites also were closed using staples. A sterile bulky dressing was applied to the  shoulder before the arm was placed into a shoulder immobilizer. The  patient was then awakened, extubated, and returned to the recovery room in satisfactory condition after tolerating the procedure well.  This procedure deserves a -22 modifier because the massive size of the rotator cuff tear required the use of a dermal allograft to complete the closure of the rotator cuff tear as described above.  This contributed to a high degree of complexity to the case and increased the length of the case by at least 30-45 minutes.

## 2021-08-11 NOTE — Anesthesia Preprocedure Evaluation (Signed)
Anesthesia Evaluation  Patient identified by MRN, date of birth, ID band Patient awake    Reviewed: Allergy & Precautions, H&P , NPO status , Patient's Chart, lab work & pertinent test results, reviewed documented beta blocker date and time   Airway Mallampati: II  TM Distance: >3 FB Neck ROM: full    Dental  (+) Teeth Intact   Pulmonary neg pulmonary ROS,    Pulmonary exam normal        Cardiovascular Exercise Tolerance: Good hypertension, On Medications + Peripheral Vascular Disease  Normal cardiovascular exam Rhythm:regular Rate:Normal     Neuro/Psych negative neurological ROS  negative psych ROS   GI/Hepatic negative GI ROS, Neg liver ROS,   Endo/Other  negative endocrine ROS  Renal/GU negative Renal ROS  negative genitourinary   Musculoskeletal   Abdominal   Peds  Hematology negative hematology ROS (+)   Anesthesia Other Findings Past Medical History: No date: Enlarged prostate No date: Hypertension No date: PAD (peripheral artery disease) (HCC) Past Surgical History: No date: HERNIA REPAIR 06/22/2020: XI ROBOTIC ASSISTED VENTRAL HERNIA; N/A     Comment:  Procedure: XI ROBOTIC ASSISTED VENTRAL HERNIA;  Surgeon:              Campbell Lerner, MD;  Location: [facility] ORS;  Service:               General;  Laterality: N/A; BMI    Body Mass Index: 30.15 kg/m     Reproductive/Obstetrics negative OB ROS                             Anesthesia Physical Anesthesia Plan  ASA: 2  Anesthesia Plan: General ETT   Post-op Pain Management: Regional block   Induction: Intravenous  PONV Risk Score and Plan: 3  Airway Management Planned: Oral ETT  Additional Equipment:   Intra-op Plan:   Post-operative Plan:   Informed Consent: I have reviewed the patients History and Physical, chart, labs and discussed the procedure including the risks, benefits and alternatives for the proposed  anesthesia with the patient or authorized representative who has indicated his/her understanding and acceptance.     Dental Advisory Given  Plan Discussed with: CRNA  Anesthesia Plan Comments:         Anesthesia Quick Evaluation

## 2021-08-11 NOTE — Anesthesia Procedure Notes (Signed)
Procedure Name: Intubation Date/Time: 08/11/2021 7:41 AM Performed by: Mohammed Kindle, CRNA Pre-anesthesia Checklist: Patient identified, Emergency Drugs available, Suction available and Patient being monitored Patient Re-evaluated:Patient Re-evaluated prior to induction Oxygen Delivery Method: Circle system utilized Preoxygenation: Pre-oxygenation with 100% oxygen Induction Type: IV induction Ventilation: Mask ventilation without difficulty Laryngoscope Size: McGraph and 3 Grade View: Grade I Tube type: Oral Tube size: 7.0 mm Number of attempts: 1 Airway Equipment and Method: Stylet and Oral airway Placement Confirmation: ETT inserted through vocal cords under direct vision, positive ETCO2, breath sounds checked- equal and bilateral and CO2 detector Secured at: 21 cm Tube secured with: Tape Dental Injury: Teeth and Oropharynx as per pre-operative assessment

## 2021-08-11 NOTE — H&P (Signed)
History of Present Illness:  Jacob Gibson is a 75 y.o. male that presents to clinic today for his preoperative history and evaluation. Patient presents unaccompanied. The patient is scheduled to undergo a left shoulder arthroscopy with debridement, decompression, rotator cuff repair, and biceps tenodesis on 08/11/21 by Dr. Joice Lofts. The patient reports a 12-month history of left shoulder pain that began after doing lateral presses while working out at the gym. He was initially evaluated by Horris Latino, PA-C and subsequent MRI revealed a full-thickness tear of the supraspinatus as well as biceps tendinopathy. The patient's symptoms have progressed to the point that they decrease his quality of life. The patient has previously undergone conservative treatment including NSAIDS and activity modification without adequate control of his symptoms.  Patient has received all necessary clearances for surgery.   Past Medical History:   Chickenpox   Hypertension   Past Surgical History:   TONSILLECTOMY   XI ROBOTIC ASSISTED VENTRAL HERNIA 06/22/2020 (Dr.Rodenberg)  Current Medications:   amLODIPine (NORVASC) 10 MG tablet Take 10 mg by mouth once daily   aspirin 81 MG chewable tablet Take 1 tablet by mouth once daily   cilostazoL (PLETAL) 100 MG tablet Take 1 tablet by mouth 2 (two) times daily   finasteride (PROSCAR) 5 mg tablet Take 1 tablet by mouth once daily   labetaloL (TRANDATE) 200 MG tablet Take 200 mg by mouth 2 (two) times daily   losartan (COZAAR) 100 MG tablet Take 100 mg by mouth once daily   valsartan (DIOVAN) 320 MG tablet Take 1 tablet by mouth once daily   Allergies:  No Known Allergies  Social History:   Socioeconomic History:   Marital status: Married  Tobacco Use   Smoking status: Former   Smokeless tobacco: Never  Building services engineer Use: Never used  Substance and Sexual Activity   Alcohol use: Never   Drug use: Never   Family History:   High blood pressure  (Hypertension) Mother   High blood pressure (Hypertension) Father   Diabetes Father   Heart failure Father   Review of Systems:  A 10+ ROS was performed, reviewed, and the pertinent orthopaedic findings are documented in the HPI.   Physical Examination:  BP 122/80 (BP Location: Left upper arm, Patient Position: Sitting, BP Cuff Size: Adult)   Ht 177.8 cm (5\' 10" )   Wt 99.3 kg (219 lb)   BMI 31.42 kg/m   Patient is a well-developed, well-nourished male in no acute distress. Patient has normal mood and affect. Patient is alert and oriented to person, place, and time.   HEENT: Atraumatic, normocephalic. Pupils equal and reactive to light. Extraocular motion intact. Noninjected sclera.  Cardiovascular: Regular rate and rhythm, with no murmurs, rubs, or gallops. Radial pulse 2+  Respiratory: Lungs clear to auscultation bilaterally.   Left shoulder exam: SKIN: normal SWELLING: none WARMTH: none LYMPH NODES: no adenopathy palpable CREPITUS: none TENDERNESS: Mildly tender over anterolateral shoulder, but no tenderness over the [facility] joint. ROM (active):  Forward flexion: 110 degrees Abduction: 100 degrees Internal rotation: L2 ROM (passive):  Forward flexion: 150 degrees Abduction: 140 degrees ER/IR at 90 abd: 90 degrees / 50 degrees   He has mild to moderate pain with forward flexion, abduction, and internal rotation.   STRENGTH: Forward flexion: 4/5 Abduction: 4/5 External rotation: 4-4+/5 Internal rotation: 4+/5 Pain with RC testing: Mild pain with resisted forward flexion more so than with resisted abduction   STABILITY: Normal   SPECIAL TESTS: [facility] test: positive, mild  Speed's test: Minimally positive Capsulitis - pain w/ passive ER: no Crossed arm test: no Crank: Not evaluated Anterior apprehension: Negative Posterior apprehension: Not evaluated   He is neurovascularly intact to the left upper extremity.  Sensation is intact over the median, radial, ulnar, and  axillary nerve distributions. Patient able to make an OK sign, thumbs up, and criss-cross the 2nd and 3rd digits.   Left Shoulder Imaging, MRI: MRI Shoulder Cartilage: No cartilage abnormality. MRI Shoulder Rotator Cuff: Full thickness tear of the supraspinatus. Retracted to the humeral head. MRI Shoulder Labrum / Biceps: Biceps tendinopathy. MRI Shoulder Bone: Normal bone.  Impression:  1. Traumatic complete tear of left rotator cuff.  2. Rotator cuff tendinitis, left.  3. Primary osteoarthritis of left shoulder.   Plan:  The treatment options, including both surgical and nonsurgical choices, have been discussed in detail with the patient.  He would like to proceed with surgical intervention to include a left shoulder arthroscopy with debridement, decompression, rotator cuff repair, and biceps tenodesis. The risks (including bleeding, infection, nerve and/or blood vessel injury, persistent or recurrent pain, loosening or failure of the components, leg length inequality, dislocation, need for further surgery, blood clots, strokes, heart attacks or arrhythmias, pneumonia, etc.) and benefits of the surgical procedure were discussed. The patient states his/her understanding and agrees to proceed. A formal written consent will be obtained by the nursing staff.   H&P reviewed and patient re-examined. No changes.

## 2021-08-11 NOTE — Transfer of Care (Signed)
Immediate Anesthesia Transfer of Care Note  Patient: Jacob Gibson  Procedure(s) Performed: SHOULDER ARTHROSCOPY WITH DEBRIDEMENT, DECOMPRESSION, LARGE ROTATOR CUFF TEAR REPAIR AND BICEPS TENODESIS. (Left: Shoulder)  Patient Location: PACU  Anesthesia Type:General  Level of Consciousness: awake, drowsy and patient cooperative  Airway & Oxygen Therapy: Patient Spontanous Breathing and Patient connected to face mask oxygen  Post-op Assessment: Report given to RN and Post -op Vital signs reviewed and stable  Post vital signs: Reviewed and stable  Last Vitals:  Vitals Value Taken Time  BP 131/68 08/11/21 1020  Temp 36.3 C 08/11/21 1020  Pulse 60 08/11/21 1029  Resp 20 08/11/21 1029  SpO2 96 % 08/11/21 1029  Vitals shown include unvalidated device data.  Last Pain:  Vitals:   08/11/21 1020  TempSrc:   PainSc: Asleep         Complications: No notable events documented.

## 2021-08-11 NOTE — Anesthesia Procedure Notes (Signed)
Anesthesia Regional Block: Interscalene brachial plexus block   Pre-Anesthetic Checklist: , timeout performed,  Correct Patient, Correct Site, Correct Laterality,  Correct Procedure, Correct Position, site marked,  Risks and benefits discussed,  Surgical consent,  Pre-op evaluation,  At surgeon's request and post-op pain management  Laterality: Upper and Left  Prep: chloraprep       Needles:  Injection technique: Single-shot  Needle Type: Echogenic Stimulator Needle     Needle Length: 10cm  Needle Gauge: 22     Additional Needles:   Procedures:, nerve stimulator,,, ultrasound used (permanent image in chart),,    Narrative:  End time: 08/11/2021 7:20 AM Injection made incrementally with aspirations every 5 mL.  Performed by: Personally  Anesthesiologist: Jacob Edwards, MD  Additional Notes: Pt. Identified and accepting of procedure after risks and benefits fully reviewed and questions answered. Time out performed and laterality confirmed prior to procedure.  ISNB  performed without difficulty and well tolerated.  Neg IV and SATD.  No pain on injection of Local anesthetic and VSST.

## 2021-08-11 NOTE — Discharge Instructions (Addendum)
Orthopedic discharge instructions: Keep dressing dry and intact.  May shower after dressing changed on post-op day #4 (Monday).  Cover staples with Band-Aids after drying off. Apply ice frequently to shoulder. Take ibuprofen 600-800 mg TID with meals for 7-10 days, then as necessary. Take oxycodone as prescribed when needed.  May supplement with ES Tylenol if necessary. Keep shoulder immobilizer on at all times except may remove for bathing purposes. Follow-up in 10-14 days or as scheduled.  AMBULATORY SURGERY  DISCHARGE INSTRUCTIONS   The drugs that you were given will stay in your system until tomorrow so for the next 24 hours you should not:  Drive an automobile Make any legal decisions Drink any alcoholic beverage   You may resume regular meals tomorrow.  Today it is better to start with liquids and gradually work up to solid foods.  You may eat anything you prefer, but it is better to start with liquids, then soup and crackers, and gradually work up to solid foods.   Please notify your doctor immediately if you have any unusual bleeding, trouble breathing, redness and pain at the surgery site, drainage, fever, or pain not relieved by medication.     Additional Instructions: Keep the Green Band on for 4 days One part rubbing alcohol 2 parts water in a gallon ziplock bag. Double bag  Put in freezer.  Re-freeze as needed

## 2021-08-12 NOTE — Anesthesia Postprocedure Evaluation (Signed)
Anesthesia Post Note  Patient: Jacob Gibson  Procedure(s) Performed: SHOULDER ARTHROSCOPY WITH DEBRIDEMENT, DECOMPRESSION, LARGE ROTATOR CUFF TEAR REPAIR WITH ALLOGRAFT APPLICATION,  AND OPEN BICEPS TENODESIS. (Left: Shoulder)  Patient location during evaluation: PACU Anesthesia Type: General Level of consciousness: awake and alert Pain management: pain level controlled Vital Signs Assessment: post-procedure vital signs reviewed and stable Respiratory status: spontaneous breathing, nonlabored ventilation, respiratory function stable and patient connected to nasal cannula oxygen Cardiovascular status: blood pressure returned to baseline and stable Postop Assessment: no apparent nausea or vomiting Anesthetic complications: no   No notable events documented.   Last Vitals:  Vitals:   08/11/21 1045 08/11/21 1206  BP: 140/72 140/73  Pulse: (!) 58 61  Resp: 18 17  Temp: (!) 36.3 C (!) 36.1 C  SpO2: 97% 92%    Last Pain:  Vitals:   08/11/21 1206  TempSrc: Temporal  PainSc: 0-No pain                 Yevette Edwards

## 2022-07-01 ENCOUNTER — Encounter: Payer: Self-pay | Admitting: Emergency Medicine

## 2022-07-01 ENCOUNTER — Emergency Department: Payer: Medicare Other

## 2022-07-01 ENCOUNTER — Other Ambulatory Visit: Payer: Self-pay

## 2022-07-01 ENCOUNTER — Emergency Department
Admission: EM | Admit: 2022-07-01 | Discharge: 2022-07-01 | Disposition: A | Discharge status: Home / Self Care | Payer: Medicare Other | Attending: Emergency Medicine | Admitting: Emergency Medicine

## 2022-07-01 DIAGNOSIS — J029 Acute pharyngitis, unspecified: Secondary | ICD-10-CM | POA: Diagnosis present

## 2022-07-01 DIAGNOSIS — R051 Acute cough: Secondary | ICD-10-CM

## 2022-07-01 DIAGNOSIS — U071 COVID-19: Secondary | ICD-10-CM | POA: Diagnosis not present

## 2022-07-01 DIAGNOSIS — I10 Essential (primary) hypertension: Secondary | ICD-10-CM | POA: Diagnosis not present

## 2022-07-01 LAB — RESP PANEL BY RT-PCR (FLU A&B, COVID) ARPGX2
Influenza A by PCR: NEGATIVE
Influenza B by PCR: NEGATIVE
SARS Coronavirus 2 by RT PCR: POSITIVE — AB

## 2022-07-01 MED ORDER — NIRMATRELVIR/RITONAVIR (PAXLOVID)TABLET: 3.0000 | ORAL_TABLET | Freq: Two times a day (BID) | ORAL | 0 refills | Status: AC

## 2022-07-01 NOTE — ED Provider Notes (Signed)
[facility] Provider Note    Event Date/Time   First MD Initiated Contact with Patient 07/01/22 [phone]     (approximate)   History   Chief Complaint Cough, Chills, and Sore Throat   HPI Jacob Gibson is a 76 y.o. male, history of hypertension, PAD, presents to the emergency department for evaluation of COVID-like symptoms.  He endorses scratchy throat, chills, and cough x48 hours.  He states he took a COVID test earlier this morning, which was positive.  Denies fever, body aches, chest pain, shortness of breath, abdominal pain, flank pain, nausea/vomiting, rash/lesions, weakness, or dizziness/lightheadedness.  History Limitations: No limitations.        Physical Exam  Triage Vital Signs: ED Triage Vitals  Enc Vitals Group     BP 07/01/22 0851 (!) 166/78     Pulse Rate 07/01/22 0851 72     Resp 07/01/22 0851 20     Temp 07/01/22 0851 98.1 F (36.7 C)     Temp Source 07/01/22 0851 Oral     SpO2 07/01/22 0853 98 %     Weight 07/01/22 0852 215 lb (97.5 kg)     Height 07/01/22 0852 5\' 10"  (1.778 m)     Head Circumference --      Peak Flow --      Pain Score 07/01/22 0852 0     Pain Loc --      Pain Edu? --      Excl. in GC? --     Most recent vital signs: Vitals:   07/01/22 0851 07/01/22 0853  BP: (!) 166/78   Pulse: 72   Resp: 20   Temp: 98.1 F (36.7 C)   SpO2:  98%    General: Awake, NAD.  Skin: Warm, dry. No rashes or lesions.  Eyes: PERRL. Conjunctivae normal.  CV: Good peripheral perfusion.  Resp: Normal effort.  Lung sounds are clear bilaterally in the apices to bases. Abd: Soft, non-tender. No distention.  Neuro: At baseline. No gross neurological deficits.  Musculoskeletal: Normal ROM of all extremities.  Focused Exam: Throat exam unremarkable.  Physical Exam    ED Results / Procedures / Treatments  Labs (all labs ordered are listed, but only abnormal results are displayed) Labs Reviewed  RESP PANEL BY RT-PCR  (FLU A&B, COVID) ARPGX2 - Abnormal; Notable for the following components:      Result Value   SARS Coronavirus 2 by RT PCR POSITIVE (*)    All other components within normal limits     EKG N/A.    RADIOLOGY  ED Provider Interpretation: I personally reviewed and interpreted this x-ray, no evidence of acute abnormalities.  DG Chest 2 View  Result Date: 07/01/2022 CLINICAL DATA:  Rule out pneumonia EXAM: CHEST - 2 VIEW COMPARISON:  None Available. FINDINGS: The heart size and mediastinal contours are within normal limits. No focal pulmonary opacity. No pleural effusion or pneumothorax. The visualized upper abdomen is unremarkable. No acute osseous abnormality. IMPRESSION: No acute cardiopulmonary abnormality. Electronically Signed   By: 13/06/2022 M.D.   On: 07/01/2022 09:23    PROCEDURES:  Critical Care performed: N/A.  Procedures    MEDICATIONS ORDERED IN ED: Medications - No data to display   IMPRESSION / MDM / ASSESSMENT AND PLAN / ED COURSE  I reviewed the triage vital signs and the nursing notes.  Differential diagnosis includes, but is not limited to, COVID-19, influenza, commune acquired pneumonia, viral URI.  Assessment/Plan Presentation consistent with COVID-19.  Confirmed by PCR.  Overall he appears well clinically.  Chest x-ray does not show any evidence of pneumonia at this time.  His vitals are within normal limits.  Given his comorbidities though, we will provide him with a prescription for Paxlovid.  Recommend that he follow-up with his primary care provider as needed.  Will discharge.  Provided the patient with anticipatory guidance, return precautions, and educational material. Encouraged the patient to return to the emergency department at any time if they begin to experience any new or worsening symptoms. Patient expressed understanding and agreed with the plan.   Patient's presentation is most consistent with acute  complicated illness / injury requiring diagnostic workup.       FINAL CLINICAL IMPRESSION(S) / ED DIAGNOSES   Final diagnoses:  Acute cough     Rx / DC Orders   ED Discharge Orders          Ordered    nirmatrelvir/ritonavir EUA (PAXLOVID) 20 x 150 MG & 10 x 100MG  TABS  2 times daily        07/01/22 1217             Note:  This document was prepared using Dragon voice recognition software and may include unintentional dictation errors.   Teodoro Spray, [place] 07/01/22 1544    Carrie Mew, MD 07/03/22 [phone]

## 2022-07-01 NOTE — Discharge Instructions (Addendum)
-  Please follow-up with your results online.  If you are positive for COVID-19, please initiate the Paxlovid treatment.  I I have provided a prescription for your wife as well.  Please avoid contact with others for at least 5 days to prevent further spread of the virus.  -Follow-up with your primary care provider as needed if your symptoms fail to improve after 1 week.  If your symptoms worsen, please return the emergency department.  -Return to the emergency department anytime if you begin to experience any new or worsening symptoms.

## 2022-07-01 NOTE — ED Triage Notes (Signed)
Pt reports yesterday started with a scratchy throat, chills and cough. Pt reports took a home COVID test and it was positive.

## 2022-07-01 NOTE — ED Notes (Signed)
See triage note. Cough and sore throat since yesterday. Positive home covid test. Unlabored respirations, ambulatory.

## 2022-10-20 IMAGING — MR MR SHOULDER*L* W/O CM
4 of 5 series · 31 of 40 positions shown · non-contrast
Comparison: None.

CLINICAL DATA: Patient complains of left shoulder pain that is
radiating down into his triceps off and on for years with limited
range of motion and weakness. Patient reports remote prior football
injury. No history of surgery reported.

EXAM:
MRI OF THE LEFT SHOULDER WITHOUT CONTRAST
TECHNIQUE: Multiplanar, multisequence MR imaging of the shoulder was performed.
No intravenous contrast was administered.

[Series 5: T2 fat-sat · axial · right · 4.0mm · 0.44mm/px · z∈[-89,+31]mm · 8 of 26 slices shown (1 of 3)]
[im 1/26]
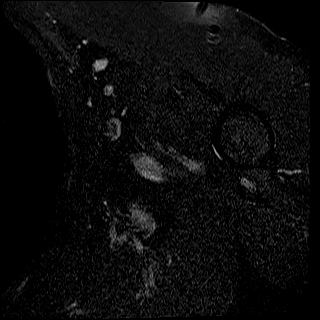
[im 4/26]
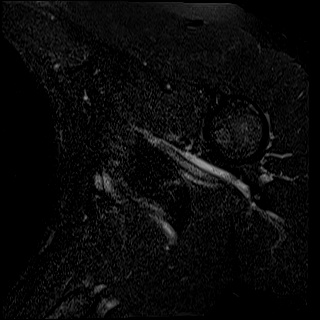
[im 8/26]
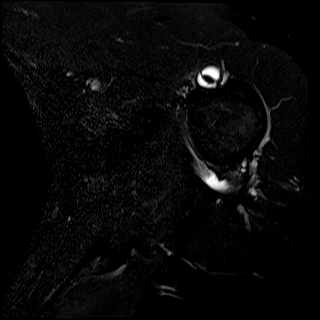
[im 11/26]
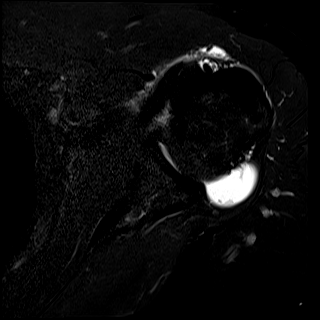
[im 15/26]
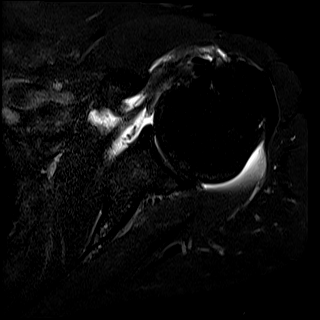
[im 18/26]
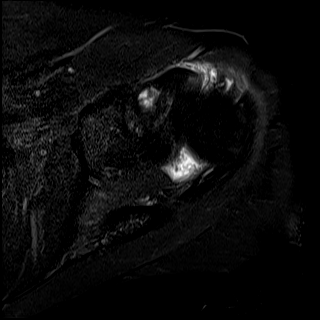
[im 22/26]
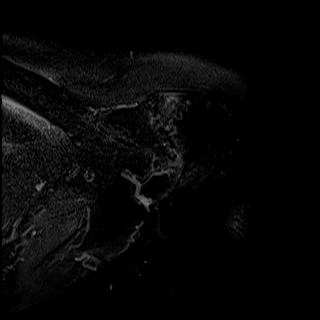
[im 26/26]
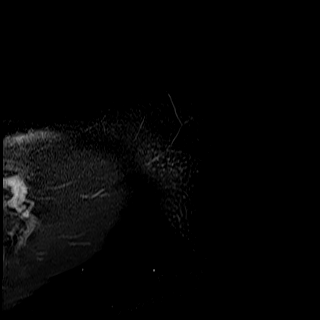

[Series 6: PD · oblique · right · 4.0mm · 0.44mm/px · 9 of 26 slices shown]
[im 1/26]
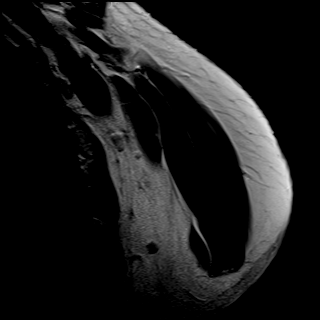
[im 4/26]
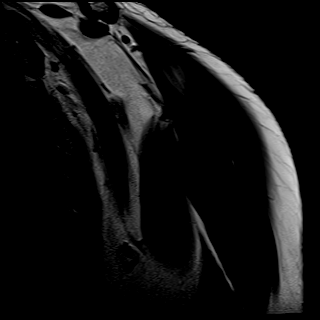
[im 7/26]
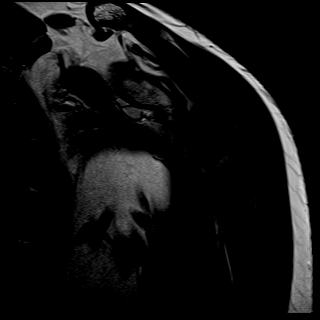
[im 10/26]
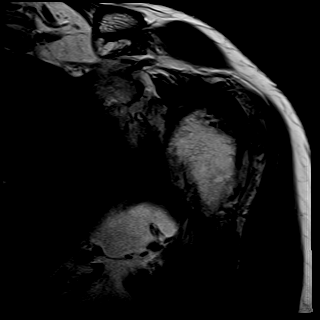
[im 13/26]
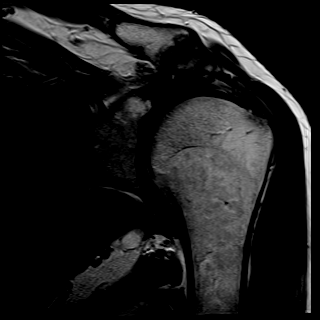
[im 16/26]
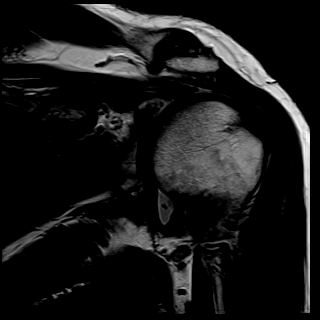
[im 19/26]
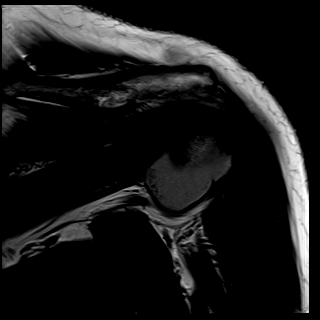
[im 22/26]
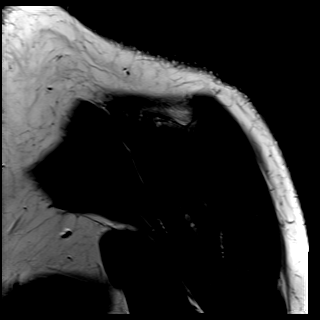
[im 26/26]
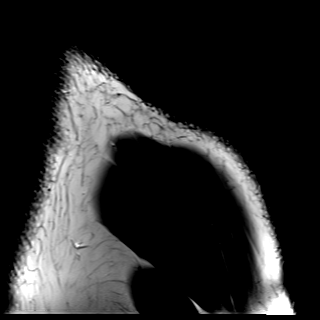

[Series 7: T2 fat-sat · oblique · right · 4.0mm · 0.44mm/px · 9 of 26 slices shown (2 of 3)]
[im 1/26]
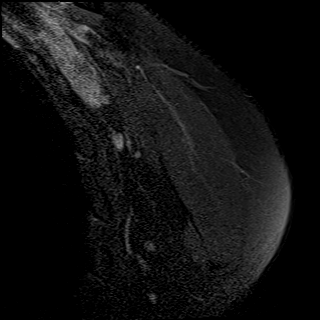
[im 4/26]
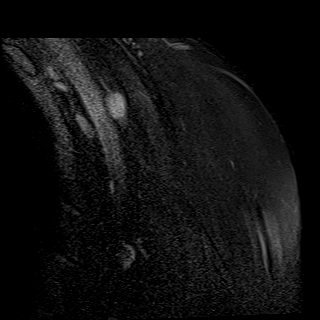
[im 7/26]
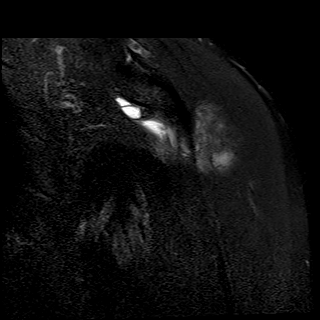
[im 10/26]
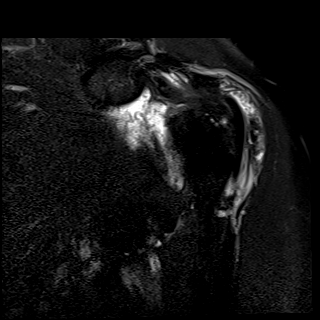
[im 13/26]
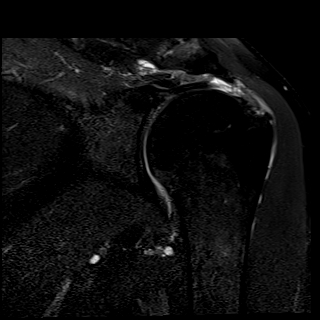
[im 16/26]
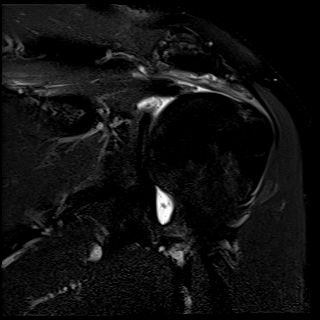
[im 19/26]
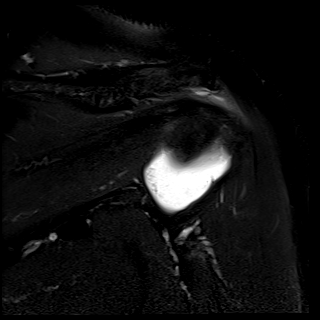
[im 22/26]
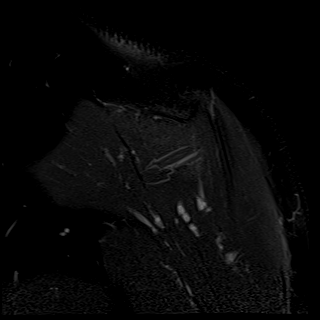
[im 26/26]
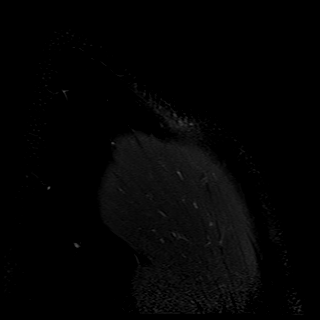

[Series 8: T2 fat-sat · oblique · right · 4.0mm · 0.22mm/px · 5 of 22 slices shown (3 of 3)]
[im 1/22]
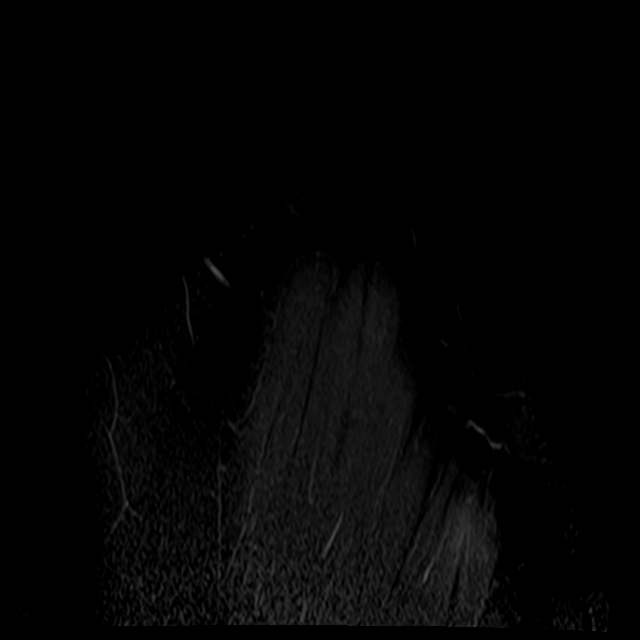
[im 4/22]
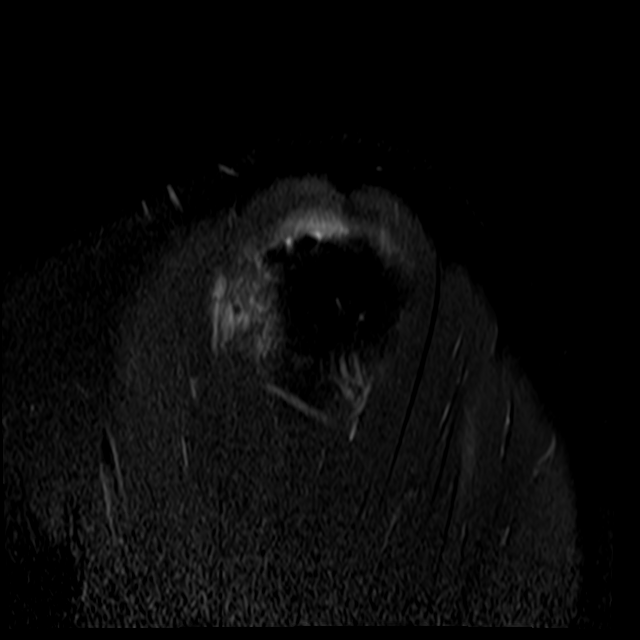
[im 8/22]
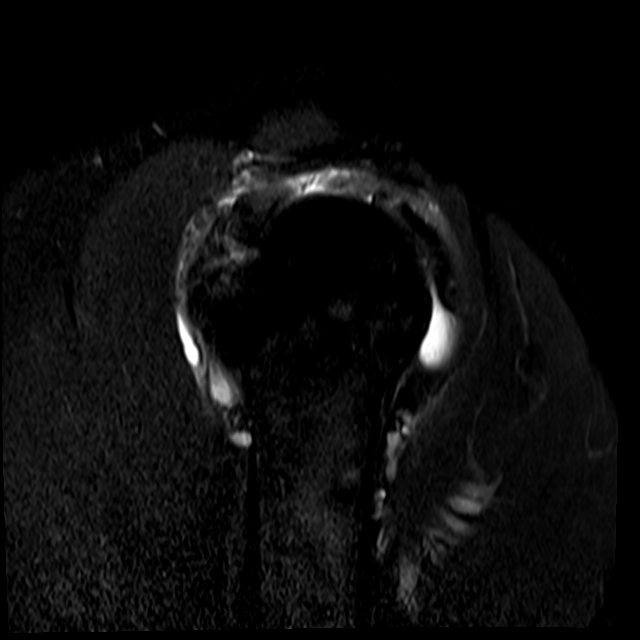
[im 11/22]
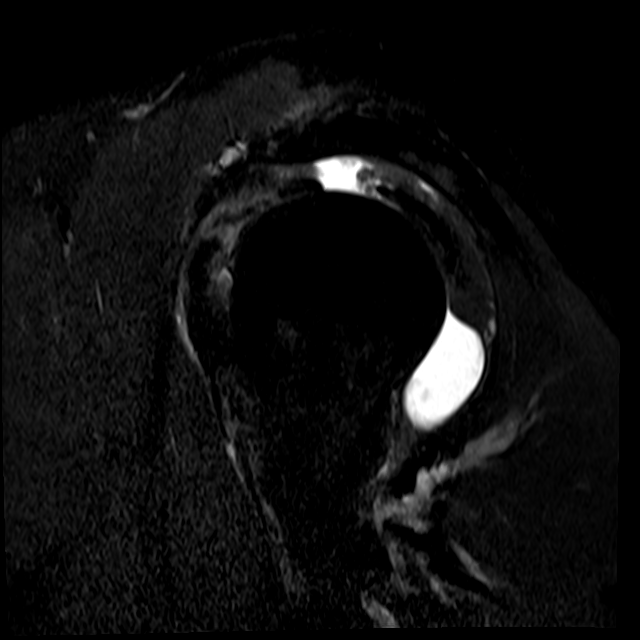
[im 18/22]
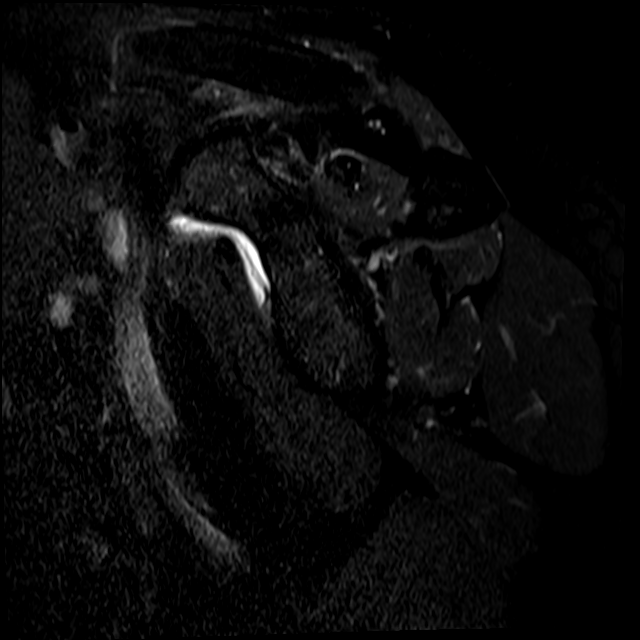

[31 of 40 positions shown; findings below may reference images not displayed]

FINDINGS: Rotator cuff: Large full-thickness, complete or near-complete tear
of the supraspinatus tendon in the region of the critical zone with
up to 2.8 cm of tendon retraction. Moderate infraspinatus and
subscapularis tendinosis. Teres minor is not well seen.

Muscles: Complete fatty atrophy of the teres minor muscle. Mild
supraspinatus intramuscular edema without significant muscle
atrophy.

Biceps long head: Severe intra-articular biceps tendinosis. Mild
tenosynovitis.

Acromioclavicular Joint: Severe arthropathy of the AC joint.
Moderate volume complex subacromial-subdeltoid bursal fluid
communicating with the glenohumeral joint.

Glenohumeral Joint: Moderate complex glenohumeral joint effusion,
probable synovitis. No focal cartilage defect.

Labrum:  Superior labrum appears degenerated.

Bones: No acute fracture or dislocation. No suspicious bone lesion.
Reactive subchondral marrow signal changes centered at the AC joint.

Other: None.
IMPRESSION: 1. Large full-thickness, complete or near-complete tear of the
supraspinatus tendon.
2. Moderate infraspinatus and subscapularis tendinosis.
3. Severe intra-articular biceps tendinosis with mild tenosynovitis.
4. Severe AC joint osteoarthritis with moderate
subacromial-subdeltoid bursitis.
5. Moderate complex glenohumeral joint effusion probable synovitis.

## 2023-04-12 ENCOUNTER — Other Ambulatory Visit: Payer: Self-pay

## 2023-04-12 ENCOUNTER — Encounter: Payer: Self-pay | Admitting: Intensive Care

## 2023-04-12 ENCOUNTER — Emergency Department
Admission: EM | Admit: 2023-04-12 | Discharge: 2023-04-12 | Disposition: A | Discharge status: Home / Self Care | Payer: Medicare Other | Attending: Emergency Medicine | Admitting: Emergency Medicine

## 2023-04-12 DIAGNOSIS — S0502XA Injury of conjunctiva and corneal abrasion without foreign body, left eye, initial encounter: Secondary | ICD-10-CM | POA: Diagnosis not present

## 2023-04-12 DIAGNOSIS — I10 Essential (primary) hypertension: Secondary | ICD-10-CM | POA: Insufficient documentation

## 2023-04-12 DIAGNOSIS — X58XXXA Exposure to other specified factors, initial encounter: Secondary | ICD-10-CM | POA: Insufficient documentation

## 2023-04-12 MED ORDER — TETRACAINE HCL 0.5 % OP SOLN
1.0000 [drp] | Freq: Once | OPHTHALMIC | Status: AC
  Administered 2023-04-12: 2 [drp] via OPHTHALMIC
  Filled 2023-04-12: qty 4

## 2023-04-12 MED ORDER — FLUORESCEIN SODIUM 1 MG OP STRP
1.0000 | ORAL_STRIP | Freq: Once | OPHTHALMIC | Status: AC
  Administered 2023-04-12: 1 via OPHTHALMIC
  Filled 2023-04-12: qty 1

## 2023-04-12 MED ORDER — TOBRAMYCIN 0.3 % OP SOLN: 1.0000 [drp] | OPHTHALMIC | 0 refills | Status: AC

## 2023-04-12 NOTE — ED Provider Notes (Signed)
[facility] Emergency Department Provider Note     Event Date/Time   First MD Initiated Contact with Patient 04/12/23 1404     (approximate)   History   Eye Pain   HPI  Jacob Gibson is a 77 y.o. male with a history of HTN and dry eyes presents to the ED for left eye pain x 2 days.  Patient reports feeling a foreign body sensation in his left eye under his eyelid.  History of retina surgery.  Endorses visual blurriness.  Patient reports rubbing his eye often due to his dry eye condition.  Denies fever and known sick contacts.   Physical Exam   Triage Vital Signs: ED Triage Vitals [04/12/23 1338]  Encounter Vitals Group     BP (!) 155/81     Systolic BP Percentile      Diastolic BP Percentile      Pulse Rate 75     Resp 16     Temp 98.1 F (36.7 C)     Temp Source Oral     SpO2 96 %     Weight 208 lb (94.3 kg)     Height 5\' 10"  (1.778 m)     Head Circumference      Peak Flow      Pain Score 3     Pain Loc      Pain Education      Exclude from Growth Chart     Most recent vital signs: Vitals:   04/12/23 1338  BP: (!) 155/81  Pulse: 75  Resp: 16  Temp: 98.1 F (36.7 C)  SpO2: 96%    General Awake, no distress.  Well-appearing HEENT NCAT. PERRL. EOMI. No rhinorrhea. Mucous membranes are moist.  CV:  Good peripheral perfusion.  RESP:  Normal effort.  ABD:  No distention.  Other:  Left eye reveals linear horizontal corneal abrasion with fluorescein uptake.  Eyelash removed with eyelid inversion.   ED Results / Procedures / Treatments   Labs (all labs ordered are listed, but only abnormal results are displayed) Labs Reviewed - No data to display  No results found.  PROCEDURES:  Critical Care performed: No  Procedures  MEDICATIONS ORDERED IN ED: Medications  tetracaine (PONTOCAINE) 0.5 % ophthalmic solution 1-2 drop (2 drops Right Eye Given by Other 04/12/23 1501)  fluorescein ophthalmic strip 1 strip (1 strip  Left Eye Given 04/12/23 1501)     IMPRESSION / MDM / ASSESSMENT AND PLAN / ED COURSE  I reviewed the triage vital signs and the nursing notes.                               77 y.o. male presents to the emergency department for evaluation and treatment of acute left eye pain. See HPI for further details.   Differential diagnosis includes, but is not limited to corneal abrasion, corneal ulcer, conjunctivitis , blepharitis  Patient's presentation is most consistent with acute complicated illness / injury requiring diagnostic workup.  Patient obtained complete resolution of his symptoms with tetracaine eyedrops.  Fluorescein lamp used.  Patient tolerated well.  Physical exam findings as stated above.  Patient will be placed on Tobrex antibiotics until further evaluation with his retinal specialist on April 27.  I encouraged ibuprofen for pain as needed.  An eyelash was removed during this procedure.  Patient is given ED precautions to return to the ED for any worsening  or new symptoms. Patient verbalizes understanding. All questions and concerns were addressed during ED visit.     FINAL CLINICAL IMPRESSION(S) / ED DIAGNOSES   Final diagnoses:  Abrasion of left cornea, initial encounter   Rx / DC Orders   ED Discharge Orders          Ordered    tobramycin (TOBREX) 0.3 % ophthalmic solution  Every 4 hours        04/12/23 1555            Note:  This document was prepared using Dragon voice recognition software and may include unintentional dictation errors.    Romeo Apple, Tamasha Laplante A, PA-C 04/12/23 2244    Shaune Pollack, MD 04/18/23 [phone]

## 2023-04-12 NOTE — Discharge Instructions (Addendum)
Your eye exam revealed a linear corneal abrasion.  An eyelash was removed during this eye exam.  You will need to follow-up with your retina specialist for further evaluation.  Antibiotic eyedrops are provided to you.  Continued scheduled appointment on August 27.

## 2023-04-12 NOTE — ED Triage Notes (Signed)
Patient presents with left eye redness and drainage X2 days.

## 2023-04-12 NOTE — ED Notes (Signed)
See triage note  Presents with left eye redness and irritation

## 2023-05-01 ENCOUNTER — Ambulatory Visit
Admission: EM | Admit: 2023-05-01 | Discharge: 2023-05-01 | Disposition: A | Discharge status: Home / Self Care | Payer: Medicare Other

## 2023-05-01 DIAGNOSIS — U071 COVID-19: Secondary | ICD-10-CM | POA: Diagnosis not present

## 2023-05-01 NOTE — ED Triage Notes (Signed)
Patient to Urgent Care with complaints of chills/ headache/ cough.   Symptoms started Thursday evening.   Home Covid test positive last night.

## 2023-05-01 NOTE — ED Provider Notes (Signed)
Jacob Gibson    CSN: [id number] Arrival date & time: 05/01/23  8295      History   Chief Complaint Chief Complaint  Patient presents with   Cough    HPI Jacob Gibson is a 77 y.o. male.   Patient presents for evaluation of nasal congestion, rhinorrhea, intermittent generalized headaches, sore throat, a nonproductive cough, mild labored breathing present for 4 days.  No known sick contacts prior.  Tolerating food and liquids.  Managing symptoms with Alka-Seltzer plus.  Home COVID test positive last night.  History of hypertension, PAD and BPH.  Past Medical History:  Diagnosis Date   Enlarged prostate    Hypertension    PAD (peripheral artery disease) [facility])     Patient Active Problem List   Diagnosis Date Noted   Status post laparoscopic hernia repair 07/06/2020    Past Surgical History:  Procedure Laterality Date   HERNIA REPAIR     SHOULDER ARTHROSCOPY WITH SUBACROMIAL DECOMPRESSION, ROTATOR CUFF REPAIR AND BICEP TENDON REPAIR Left 08/11/2021   Procedure: SHOULDER ARTHROSCOPY WITH DEBRIDEMENT, DECOMPRESSION, LARGE ROTATOR CUFF TEAR REPAIR WITH ALLOGRAFT APPLICATION,  AND OPEN BICEPS TENODESIS.;  Surgeon: Christena Flake, MD;  Location: [facility] ORS;  Service: Orthopedics;  Laterality: Left;   XI ROBOTIC ASSISTED VENTRAL HERNIA N/A 06/22/2020   Procedure: XI ROBOTIC ASSISTED VENTRAL HERNIA;  Surgeon: Campbell Lerner, MD;  Location: [facility] ORS;  Service: General;  Laterality: N/A;       Home Medications    Prior to Admission medications   Medication Sig Start Date End Date Taking? Authorizing Provider  rosuvastatin (CRESTOR) 20 MG tablet Take 20 mg by mouth at bedtime. 02/14/23  Yes [provider]  amLODipine (NORVASC) 10 MG tablet Take 10 mg by mouth daily. 06/05/20   [provider]  aspirin 81 MG chewable tablet Take 81 mg by mouth daily.    [provider]  carboxymethylcellulose (REFRESH PLUS) 0.5 % SOLN Place 1 drop into both  eyes 2 (two) times daily as needed (dry eye).    [provider]  cilostazol (PLETAL) 100 MG tablet Take 100 mg by mouth 2 (two) times daily. 05/10/17   [provider]  finasteride (PROSCAR) 5 MG tablet Take 5 mg by mouth daily.    [provider]  labetalol (NORMODYNE) 200 MG tablet Take 200 mg by mouth 2 (two) times daily. 12/31/19   [provider]  losartan (COZAAR) 100 MG tablet Take 100 mg by mouth daily. 05/22/20   [provider]  oxyCODONE (ROXICODONE) 5 MG immediate release tablet Take 1-2 tablets (5-10 mg total) by mouth every 4 (four) hours as needed for moderate pain or severe pain. 08/11/21   Poggi, Excell Seltzer, MD    Family History Family History  Problem Relation Age of Onset   Diabetes Father     Social History Social History   Tobacco Use   Smoking status: Former    Types: Cigarettes   Smokeless tobacco: Never  Vaping Use   Vaping status: Never Used  Substance Use Topics   Alcohol use: Never   Drug use: Never     Allergies   Patient has no known allergies.   Review of Systems Review of Systems  Respiratory:  Positive for cough.      Physical Exam Triage Vital Signs ED Triage Vitals  Encounter Vitals Group     BP 05/01/23 0811 (!) 168/83     Systolic BP Percentile --  Diastolic BP Percentile --      Pulse Rate 05/01/23 0811 63     Resp 05/01/23 0811 19     Temp 05/01/23 0811 97.9 F (36.6 C)     Temp src --      SpO2 05/01/23 0811 97 %     Weight --      Height --      Head Circumference --      Peak Flow --      Pain Score 05/01/23 0808 2     Pain Loc --      Pain Education --      Exclude from Growth Chart --    No data found.  Updated Vital Signs BP (!) 168/83   Pulse 63   Temp 97.9 F (36.6 C)   Resp 19   SpO2 97%   Visual Acuity Right Eye Distance:   Left Eye Distance:   Bilateral Distance:    Right Eye Near:   Left Eye Near:    Bilateral Near:     Physical  Exam Constitutional:      Appearance: Normal appearance.  HENT:     Head: Normocephalic.     Right Ear: Tympanic membrane, ear canal and external ear normal.     Left Ear: Tympanic membrane, ear canal and external ear normal.     Nose: Congestion present. No rhinorrhea.     Mouth/Throat:     Pharynx: Posterior oropharyngeal erythema present. No oropharyngeal exudate.  Eyes:     Extraocular Movements: Extraocular movements intact.  Cardiovascular:     Rate and Rhythm: Normal rate and regular rhythm.     Pulses: Normal pulses.     Heart sounds: Normal heart sounds.  Pulmonary:     Effort: Pulmonary effort is normal.     Breath sounds: Normal breath sounds.  Musculoskeletal:     Cervical back: Normal range of motion and neck supple.  Skin:    General: Skin is warm and dry.  Neurological:     Mental Status: He is alert and oriented to person, place, and time. Mental status is at baseline.      UC Treatments / Results  Labs (all labs ordered are listed, but only abnormal results are displayed) Labs Reviewed - No data to display  EKG   Radiology No results found.  Procedures Procedures (including critical care time)  Medications Ordered in UC Medications - No data to display  Initial Impression / Assessment and Plan / UC Course  I have reviewed the triage vital signs and the nursing notes.  Pertinent labs & imaging results that were available during my care of the patient were reviewed by me and considered in my medical decision making (see chart for details).  COVID-19  Patient is in no signs of distress nor toxic appearing.  Vital signs are stable.  Low suspicion for pneumonia, pneumothorax or bronchitis and therefore will defer imaging.  Discussed use of antiviral, at this time would like to defer.  Discussed quarantine per CDC. managing well at home with Alka-Seltzer.  Endorses labored breathing is minimal and would like to hold off on treatment.  Lungs are clear  and O2 saturation 97% on room air.May use additional over-the-counter medications as needed for supportive care.  May follow-up with urgent care as needed if symptoms persist or worsen.  Final Clinical Impressions(s) / UC Diagnoses   Final diagnoses:  COVID-19     Discharge Instructions      COVID-19  caused by a virus and should steadily improve in time, with most viruses it can take up to 7 to 10 days before you truly start to see a turnaround however things will get better  Per the CDC you will only need to quarantine until you are without a fever for 24 hours, if no fever you may continue activity wearing a mask until all symptoms have resolved  You may continue use of Alka-Seltzer as it has been helpful in managing your symptoms, may also attempt any of the following below as needed    You can take Tylenol and/or Ibuprofen as needed for fever reduction and pain relief.   For cough: honey 1/2 to 1 teaspoon (you can dilute the honey in water or another fluid).  You can also use guaifenesin and dextromethorphan for cough. You can use a humidifier for chest congestion and cough.  If you don't have a humidifier, you can sit in the bathroom with the hot shower running.      For sore throat: try warm salt water gargles, cepacol lozenges, throat spray, warm tea or water with lemon/honey, popsicles or ice, or OTC cold relief medicine for throat discomfort.   For congestion: take a daily anti-histamine like Zyrtec, Claritin, and a oral decongestant, such as pseudoephedrine.  You can also use Flonase 1-2 sprays in each nostril daily.   It is important to stay hydrated: drink plenty of fluids (water, gatorade/powerade/pedialyte, juices, or teas) to keep your throat moisturized and help further relieve irritation/discomfort.   ED Prescriptions   None    PDMP not reviewed this encounter.   Valinda Hoar, [place] 05/01/23 [phone]

## 2023-05-01 NOTE — Discharge Instructions (Signed)
COVID-19 caused by a virus and should steadily improve in time, with most viruses it can take up to 7 to 10 days before you truly start to see a turnaround however things will get better  Per the CDC you will only need to quarantine until you are without a fever for 24 hours, if no fever you may continue activity wearing a mask until all symptoms have resolved  You may continue use of Alka-Seltzer as it has been helpful in managing your symptoms, may also attempt any of the following below as needed    You can take Tylenol and/or Ibuprofen as needed for fever reduction and pain relief.   For cough: honey 1/2 to 1 teaspoon (you can dilute the honey in water or another fluid).  You can also use guaifenesin and dextromethorphan for cough. You can use a humidifier for chest congestion and cough.  If you don't have a humidifier, you can sit in the bathroom with the hot shower running.      For sore throat: try warm salt water gargles, cepacol lozenges, throat spray, warm tea or water with lemon/honey, popsicles or ice, or OTC cold relief medicine for throat discomfort.   For congestion: take a daily anti-histamine like Zyrtec, Claritin, and a oral decongestant, such as pseudoephedrine.  You can also use Flonase 1-2 sprays in each nostril daily.   It is important to stay hydrated: drink plenty of fluids (water, gatorade/powerade/pedialyte, juices, or teas) to keep your throat moisturized and help further relieve irritation/discomfort.

## 2023-11-02 ENCOUNTER — Ambulatory Visit
Admission: EM | Admit: 2023-11-02 | Discharge: 2023-11-02 | Disposition: A | Discharge status: Home / Self Care | Attending: Emergency Medicine | Admitting: Emergency Medicine

## 2023-11-02 DIAGNOSIS — H109 Unspecified conjunctivitis: Secondary | ICD-10-CM | POA: Diagnosis not present

## 2023-11-02 MED ORDER — MOXIFLOXACIN HCL 0.5 % OP SOLN: 1.0000 [drp] | Freq: Three times a day (TID) | OPHTHALMIC | 0 refills | Status: DC

## 2023-11-02 NOTE — ED Provider Notes (Signed)
 Jacob Gibson    CSN: [id number] Arrival date & time: 11/02/23  1014      History   Chief Complaint Chief Complaint  Patient presents with   Eye Problem    HPI Jacob Gibson is a 78 y.o. male.   Patient presents for evaluation of irritation to the corner of the left eye, redness, pruritus and drainage present for 3 days.  History of dry eyes.  Has attempted use of over-the-counter eyedrops which have been ineffective.  Denies use of contacts.  Past Medical History:  Diagnosis Date   Enlarged prostate    Hypertension    PAD (peripheral artery disease) [facility])     Patient Active Problem List   Diagnosis Date Noted   Status post laparoscopic hernia repair 07/06/2020    Past Surgical History:  Procedure Laterality Date   HERNIA REPAIR     SHOULDER ARTHROSCOPY WITH SUBACROMIAL DECOMPRESSION, ROTATOR CUFF REPAIR AND BICEP TENDON REPAIR Left 08/11/2021   Procedure: SHOULDER ARTHROSCOPY WITH DEBRIDEMENT, DECOMPRESSION, LARGE ROTATOR CUFF TEAR REPAIR WITH ALLOGRAFT APPLICATION,  AND OPEN BICEPS TENODESIS.;  Surgeon: Christena Flake, MD;  Location: [facility] ORS;  Service: Orthopedics;  Laterality: Left;   XI ROBOTIC ASSISTED VENTRAL HERNIA N/A 06/22/2020   Procedure: XI ROBOTIC ASSISTED VENTRAL HERNIA;  Surgeon: Campbell Lerner, MD;  Location: [facility] ORS;  Service: General;  Laterality: N/A;       Home Medications    Prior to Admission medications   Medication Sig Start Date End Date Taking? Authorizing Provider  amLODipine (NORVASC) 10 MG tablet Take 10 mg by mouth daily. 06/05/20  Yes [provider]  aspirin 81 MG chewable tablet Take 81 mg by mouth daily.   Yes [provider]  carboxymethylcellulose (REFRESH PLUS) 0.5 % SOLN Place 1 drop into both eyes 2 (two) times daily as needed (dry eye).   Yes [provider]  cilostazol (PLETAL) 100 MG tablet Take 100 mg by mouth 2 (two) times daily. 05/10/17  Yes [provider]   finasteride (PROSCAR) 5 MG tablet Take 5 mg by mouth daily.   Yes [provider]  labetalol (NORMODYNE) 200 MG tablet Take 200 mg by mouth 2 (two) times daily. 12/31/19  Yes [provider]  losartan (COZAAR) 100 MG tablet Take 100 mg by mouth daily. 05/22/20  Yes [provider]  moxifloxacin (VIGAMOX) 0.5 % ophthalmic solution Place 1 drop into the left eye 3 (three) times daily. 11/02/23  Yes Sacheen Arrasmith R, NP  oxyCODONE (ROXICODONE) 5 MG immediate release tablet Take 1-2 tablets (5-10 mg total) by mouth every 4 (four) hours as needed for moderate pain or severe pain. 08/11/21  Yes Poggi, Excell Seltzer, MD  rosuvastatin (CRESTOR) 20 MG tablet Take 20 mg by mouth at bedtime. 02/14/23  Yes [provider]    Family History Family History  Problem Relation Age of Onset   Diabetes Father     Social History Social History   Tobacco Use   Smoking status: Former    Types: Cigarettes   Smokeless tobacco: Never  Vaping Use   Vaping status: Never Used  Substance Use Topics   Alcohol use: Never   Drug use: Never     Allergies   Patient has no known allergies.   Review of Systems Review of Systems   Physical Exam Triage Vital Signs ED Triage Vitals  Encounter Vitals Group     BP 11/02/23 1024 (!) 202/79     Systolic BP Percentile --  Diastolic BP Percentile --      Pulse Rate 11/02/23 1024 73     Resp --      Temp 11/02/23 1024 98 F (36.7 C)     Temp Source 11/02/23 1024 Oral     SpO2 11/02/23 1024 96 %     Weight 11/02/23 1023 205 lb (93 kg)     Height 11/02/23 1023 5\' 11"  (1.803 m)     Head Circumference --      Peak Flow --      Pain Score 11/02/23 1023 7     Pain Loc --      Pain Education --      Exclude from Growth Chart --    No data found.  Updated Vital Signs BP (!) 202/79 (BP Location: Left Arm)   Pulse 73   Temp 98 F (36.7 C) (Oral)   Ht 5\' 11"  (1.803 m)   Wt 205 lb (93 kg)   SpO2 96%   BMI 28.59 kg/m    Visual Acuity Right Eye Distance:   Left Eye Distance:   Bilateral Distance:    Right Eye Near:   Left Eye Near:    Bilateral Near:     Physical Exam Constitutional:      Appearance: Normal appearance.  Eyes:     Comments: Erythema present to the left conjunctiva, scant drainage of all the water line, no presence of stye, vision grossly intact, extraocular movements intact  Neurological:     Mental Status: He is alert.      UC Treatments / Results  Labs (all labs ordered are listed, but only abnormal results are displayed) Labs Reviewed - No data to display  EKG   Radiology No results found.  Procedures Procedures (including critical care time)  Medications Ordered in UC Medications - No data to display  Initial Impression / Assessment and Plan / UC Course  I have reviewed the triage vital signs and the nursing notes.  Pertinent labs & imaging results that were available during my care of the patient were reviewed by me and considered in my medical decision making (see chart for details).  Bacterial conjunctivitis of left eye  Patient is symptomology consistent with above diagnosis, discussed with patient, prescribed moxifloxacin and discussed administration, recommended supportive care with follow-up if symptoms persist or worsen Final Clinical Impressions(s) / UC Diagnoses   Final diagnoses:  Bacterial conjunctivitis of left eye     Discharge Instructions      Today you being treated for bacterial conjunctivitis.   Place one drop of moxifloxacin into the effected eye every 8 hours while awake for 7 days. If the other eye starts to have symptoms you may use medication in it as well. Do not allow tip of dropper to touch eye.  May use cool compress for comfort and to remove discharge if present. Pat the eye, do not wipe.  Do not rub eyes, this may cause more irritation.  May use Claritin or Zyrtec benadryl as needed to help if itching present.  If  symptoms persist after use of medication, please follow up at Urgent Care or with ophthalmologist (eye doctor)    ED Prescriptions     Medication Sig Dispense Auth. Provider   moxifloxacin (VIGAMOX) 0.5 % ophthalmic solution Place 1 drop into the left eye 3 (three) times daily. 3 mL Valinda Hoar, NP      PDMP not reviewed this encounter.   Valinda Hoar, NP 11/02/23 1044

## 2023-11-02 NOTE — ED Triage Notes (Signed)
 Pt c/o left eye drainage, itching x3days  Pt states that he has dry eyes  Took pt blood pressure twice. First was 202/79. Second was 202/81

## 2023-11-02 NOTE — Discharge Instructions (Addendum)
 Today you being treated for bacterial conjunctivitis.   Place one drop of moxifloxacin into the effected eye every 8 hours while awake for 7 days. If the other eye starts to have symptoms you may use medication in it as well. Do not allow tip of dropper to touch eye.  May use cool compress for comfort and to remove discharge if present. Pat the eye, do not wipe.  Do not rub eyes, this may cause more irritation.  May use Claritin or Zyrtec benadryl as needed to help if itching present.  If symptoms persist after use of medication, please follow up at Urgent Care or with ophthalmologist (eye doctor)

## 2024-09-04 ENCOUNTER — Encounter: Payer: Self-pay | Admitting: Emergency Medicine

## 2024-09-04 ENCOUNTER — Ambulatory Visit
Admission: EM | Admit: 2024-09-04 | Discharge: 2024-09-04 | Disposition: A | Discharge status: Home / Self Care | Attending: Emergency Medicine | Admitting: Emergency Medicine

## 2024-09-04 DIAGNOSIS — I10 Essential (primary) hypertension: Secondary | ICD-10-CM

## 2024-09-04 DIAGNOSIS — H1032 Unspecified acute conjunctivitis, left eye: Secondary | ICD-10-CM | POA: Diagnosis not present

## 2024-09-04 MED ORDER — POLYMYXIN B-TRIMETHOPRIM 10000-0.1 UNIT/ML-% OP SOLN: 1.0000 [drp] | Freq: Four times a day (QID) | OPHTHALMIC | 0 refills | Status: AC

## 2024-09-04 NOTE — ED Provider Notes (Signed)
 " Jacob Gibson    CSN: [id number] Arrival date & time: 09/04/24  9082      History   Chief Complaint Chief Complaint  Patient presents with   Conjunctivitis    HPI Jacob Gibson is a 79 y.o. male.  Patient presents with 2-day history of redness, itching, drainage, blurred vision in his left eye.  He states his eyelashes are matted in the mornings.  No eye trauma, eye pain.  He has been treating his symptoms with OTC eyedrops.  He wears glasses.  No fever, chest pain, shortness of breath.  His medical history includes hypertension.  The history is provided by the patient and medical records.    Past Medical History:  Diagnosis Date   Enlarged prostate    Hypertension    PAD (peripheral artery disease)     Patient Active Problem List   Diagnosis Date Noted   Status post laparoscopic hernia repair 07/06/2020    Past Surgical History:  Procedure Laterality Date   HERNIA REPAIR     SHOULDER ARTHROSCOPY WITH SUBACROMIAL DECOMPRESSION, ROTATOR CUFF REPAIR AND BICEP TENDON REPAIR Left 08/11/2021   Procedure: SHOULDER ARTHROSCOPY WITH DEBRIDEMENT, DECOMPRESSION, LARGE ROTATOR CUFF TEAR REPAIR WITH ALLOGRAFT APPLICATION,  AND OPEN BICEPS TENODESIS.;  Surgeon: Edie Norleen PARAS, MD;  Location: [facility];  Service: Orthopedics;  Laterality: Left;   XI ROBOTIC ASSISTED VENTRAL HERNIA N/A 06/22/2020   Procedure: XI ROBOTIC ASSISTED VENTRAL HERNIA;  Surgeon: Lane Shope, MD;  Location: [facility];  Service: General;  Laterality: N/A;       Home Medications    Prior to Admission medications  Medication Sig Start Date End Date Taking? Authorizing Provider  trimethoprim -polymyxin b  (POLYTRIM ) ophthalmic solution Place 1 drop into both eyes 4 (four) times daily for 7 days. 09/04/24 09/11/24 Yes Corlis Burnard DEL, NP  amLODipine  (NORVASC ) 10 MG tablet Take 10 mg by mouth daily. 06/05/20   [provider]  aspirin 81 MG chewable tablet Take 81 mg by mouth daily.     [provider]  carboxymethylcellulose (REFRESH PLUS) 0.5 % SOLN Place 1 drop into both eyes 2 (two) times daily as needed (dry eye).    [provider]  cilostazol (PLETAL) 100 MG tablet Take 100 mg by mouth 2 (two) times daily. 05/10/17   [provider]  finasteride (PROSCAR) 5 MG tablet Take 5 mg by mouth daily.    [provider]  labetalol  (NORMODYNE ) 200 MG tablet Take 200 mg by mouth 2 (two) times daily. 12/31/19   [provider]  losartan  (COZAAR ) 100 MG tablet Take 100 mg by mouth daily. 05/22/20   [provider]  oxyCODONE  (ROXICODONE ) 5 MG immediate release tablet Take 1-2 tablets (5-10 mg total) by mouth every 4 (four) hours as needed for moderate pain or severe pain. 08/11/21   Poggi, Jameis J, MD  rosuvastatin (CRESTOR) 20 MG tablet Take 20 mg by mouth at bedtime. 02/14/23   [provider]    Family History Family History  Problem Relation Age of Onset   Diabetes Father     Social History Social History[1]   Allergies   Patient has no known allergies.   Review of Systems Review of Systems  Constitutional:  Negative for chills and fever.  Eyes:  Positive for discharge, redness, itching and visual disturbance. Negative for pain.  Respiratory:  Negative for cough and shortness of breath.   Cardiovascular:  Negative for chest pain and palpitations.  Physical Exam Triage Vital Signs ED Triage Vitals  Encounter Vitals Group     BP 09/04/24 0932 (!) 159/77     Girls Systolic BP Percentile --      Girls Diastolic BP Percentile --      Boys Systolic BP Percentile --      Boys Diastolic BP Percentile --      Pulse Rate 09/04/24 0932 67     Resp 09/04/24 0932 18     Temp 09/04/24 0932 98.1 F (36.7 C)     Temp Source 09/04/24 0932 Oral     SpO2 09/04/24 0932 98 %     Weight 09/04/24 0929 210 lb (95.3 kg)     Height 09/04/24 0929 5' 10 (1.778 m)     Head Circumference --      Peak Flow --      Pain  Score 09/04/24 0929 0     Pain Loc --      Pain Education --      Exclude from Growth Chart --    No data found.  Updated Vital Signs BP (!) 159/77   Pulse 67   Temp 98.1 F (36.7 C) (Oral)   Resp 18   Ht 5' 10 (1.778 m)   Wt 210 lb (95.3 kg)   SpO2 98%   BMI 30.13 kg/m   Visual Acuity Right Eye Distance:   Left Eye Distance:   Bilateral Distance:    Right Eye Near: R Near: 20/30 Left Eye Near:  L Near: 20/100 Bilateral Near:  20/20 (With glasses)  Physical Exam Constitutional:      General: He is not in acute distress. HENT:     Mouth/Throat:     Mouth: Mucous membranes are moist.  Eyes:     General: Lids are normal.     Extraocular Movements: Extraocular movements intact.     Conjunctiva/sclera:     Left eye: Left conjunctiva is injected.     Pupils: Pupils are equal, round, and reactive to light.  Cardiovascular:     Rate and Rhythm: Normal rate and regular rhythm.     Heart sounds: Normal heart sounds.  Pulmonary:     Effort: Pulmonary effort is normal. No respiratory distress.     Breath sounds: Normal breath sounds.  Neurological:     Mental Status: He is alert.      UC Treatments / Results  Labs (all labs ordered are listed, but only abnormal results are displayed) Labs Reviewed - No data to display  EKG   Radiology No results found.  Procedures Procedures (including critical care time)  Medications Ordered in UC Medications - No data to display  Initial Impression / Assessment and Plan / UC Course  I have reviewed the triage vital signs and the nursing notes.  Pertinent labs & imaging results that were available during my care of the patient were reviewed by me and considered in my medical decision making (see chart for details).    Left bacterial conjunctivitis, elevated blood pressure reading with hypertension.  Blood pressure 159/77.  Afebrile.  Lungs are clear and O2 sat is 98% on room air.  Treating conjunctivitis with Polytrim   eyedrops.  Instructed patient to follow-up with his eye care provider.  ED precautions given.  Education provided on bacterial conjunctivitis.  Discussed with patient that his blood pressure is elevated today and needs to be rechecked by his PCP.  Education provided on managing hypertension.  He agrees to plan of  care.  Final Clinical Impressions(s) / UC Diagnoses   Final diagnoses:  Acute bacterial conjunctivitis of left eye  Elevated blood pressure reading in office with diagnosis of hypertension     Discharge Instructions      Use the antibiotic eyedrops as prescribed.  Follow-up with your eye care provider.  Go to the emergency department if you have acute eye pain, changes in your vision, or other concerning symptoms.    Your blood pressure is elevated today at 159/77.  Please have this rechecked by your primary care provider.          ED Prescriptions     Medication Sig Dispense Auth. Provider   trimethoprim -polymyxin b  (POLYTRIM ) ophthalmic solution Place 1 drop into both eyes 4 (four) times daily for 7 days. 10 mL Corlis Burnard DEL, NP      PDMP not reviewed this encounter.    [1]  Social History Tobacco Use   Smoking status: Former    Types: Cigarettes   Smokeless tobacco: Never  Vaping Use   Vaping status: Never Used  Substance Use Topics   Alcohol use: Never   Drug use: Never     Corlis Burnard DEL, NP 09/04/24 [phone]  "

## 2024-09-04 NOTE — ED Triage Notes (Signed)
 Patient in office today complaint of left eye issue. Redness,gritty and swelling x2d  OTC: PM refresh  Denies: fever

## 2024-09-04 NOTE — Discharge Instructions (Addendum)
 Use the antibiotic eyedrops as prescribed.  Follow-up with your eye care provider.  Go to the emergency department if you have acute eye pain, changes in your vision, or other concerning symptoms.    Your blood pressure is elevated today at 159/77.  Please have this rechecked by your primary care provider.
